# Patient Record
Sex: Male | Born: 1970 | Race: White | Hispanic: No | Marital: Single | Smoking: Never smoker
Health system: Southern US, Community
[De-identification: ages and names within clinical notes are randomized; demographics above are authoritative.]

## PROBLEM LIST (undated history)

## (undated) DIAGNOSIS — K219 Gastro-esophageal reflux disease without esophagitis: Secondary | ICD-10-CM

## (undated) DIAGNOSIS — L719 Rosacea, unspecified: Secondary | ICD-10-CM

## (undated) DIAGNOSIS — N200 Calculus of kidney: Secondary | ICD-10-CM

## (undated) DIAGNOSIS — K449 Diaphragmatic hernia without obstruction or gangrene: Secondary | ICD-10-CM

## (undated) DIAGNOSIS — R350 Frequency of micturition: Secondary | ICD-10-CM

## (undated) DIAGNOSIS — N201 Calculus of ureter: Secondary | ICD-10-CM

## (undated) DIAGNOSIS — R319 Hematuria, unspecified: Secondary | ICD-10-CM

## (undated) DIAGNOSIS — N1330 Unspecified hydronephrosis: Secondary | ICD-10-CM

## (undated) DIAGNOSIS — N471 Phimosis: Secondary | ICD-10-CM

## (undated) DIAGNOSIS — Z973 Presence of spectacles and contact lenses: Secondary | ICD-10-CM

## (undated) HISTORY — PX: ESOPHAGOGASTRODUODENOSCOPY: SHX1529

---

## 2015-11-13 ENCOUNTER — Emergency Department (HOSPITAL_COMMUNITY)
Admission: EM | Admit: 2015-11-13 | Discharge: 2015-11-14 | Disposition: A | Discharge status: Home / Self Care | Payer: BLUE CROSS/BLUE SHIELD | Attending: Emergency Medicine | Admitting: Emergency Medicine

## 2015-11-13 ENCOUNTER — Encounter (HOSPITAL_COMMUNITY): Payer: Self-pay

## 2015-11-13 DIAGNOSIS — N23 Unspecified renal colic: Secondary | ICD-10-CM

## 2015-11-13 DIAGNOSIS — F17200 Nicotine dependence, unspecified, uncomplicated: Secondary | ICD-10-CM | POA: Insufficient documentation

## 2015-11-13 DIAGNOSIS — N132 Hydronephrosis with renal and ureteral calculous obstruction: Secondary | ICD-10-CM | POA: Diagnosis not present

## 2015-11-13 DIAGNOSIS — E876 Hypokalemia: Secondary | ICD-10-CM | POA: Diagnosis not present

## 2015-11-13 DIAGNOSIS — R109 Unspecified abdominal pain: Secondary | ICD-10-CM | POA: Diagnosis present

## 2015-11-13 MED ORDER — FENTANYL CITRATE (PF) 100 MCG/2ML IJ SOLN
50.0000 ug | INTRAMUSCULAR | Status: DC | PRN
  Administered 2015-11-14: 50 ug via INTRAVENOUS
  Filled 2015-11-13: qty 2

## 2015-11-13 MED ORDER — ONDANSETRON 4 MG PO TBDP
4.0000 mg | ORAL_TABLET | Freq: Once | ORAL | Status: AC
  Administered 2015-11-13: 4 mg via ORAL

## 2015-11-13 MED ORDER — ONDANSETRON 4 MG PO TBDP
ORAL_TABLET | ORAL | Status: AC
  Filled 2015-11-13: qty 1

## 2015-11-13 NOTE — ED Provider Notes (Signed)
[facility]-EMERGENCY DEPT Provider Note   CSN: [id number] Arrival date & time: 11/13/15  2245     History   Chief Complaint No chief complaint on file.   HPI Peter Mills is a 45 y.o. male with a hx of Hypertension (diet and exercise controlled) presents to the Emergency Department complaining of gradual, persistent, progressively worsening left flank pain onset 3 weeks ago that acutely worsened to 10/10 at 7 PM. Patient reports he feels as if there is a hot iron and his left flank. He reports some decreased and very dark urine. He also reports that he has been out a football came all day and had minimal water. Patient reports a family history of chronic kidney disease but he does not personally have any known kidney disease. No history of kidney stones. Nothing makes it symptoms better or worse. He's had associated nausea, vomiting and dry heaves.    The history is provided by the patient and medical records. No language interpreter was used.    History reviewed. No pertinent past medical history.  There are no active problems to display for this patient.   History reviewed. No pertinent surgical history.     Home Medications    Prior to Admission medications   Medication Sig Start Date End Date Taking? Authorizing Provider  doxycycline (VIBRAMYCIN) 50 MG capsule Take 50 mg by mouth daily. 10/07/15  Yes Historical Provider, MD  ondansetron (ZOFRAN ODT) 4 MG disintegrating tablet 4mg  ODT q4 hours prn nausea/vomit 11/14/15   Najeeb Uptain, PA-C  oxyCODONE (ROXICODONE) 5 MG immediate release tablet Take 1 tablet (5 mg total) by mouth every 6 (six) hours as needed for severe pain. 11/14/15   Van Ehlert, PA-C  tamsulosin (FLOMAX) 0.4 MG CAPS capsule Take 1 capsule (0.4 mg total) by mouth 2 (two) times daily. 11/14/15   Dahlia ClientHannah Halimah Bewick, PA-C    Family History No family history on file.  Social History Social History  Substance Use Topics  . Smoking status: Current  Every Day Smoker  . Smokeless tobacco: Never Used  . Alcohol use No     Allergies   Hydrocodone   Review of Systems Review of Systems  Gastrointestinal: Negative for abdominal pain.  Genitourinary: Positive for decreased urine volume and flank pain. Negative for dysuria, penile pain, penile swelling and testicular pain.       Dark urine  All other systems reviewed and are negative.    Physical Exam Updated Vital Signs BP 128/78   Pulse 77   Temp 98 F (36.7 C) (Oral)   Resp 18   Ht 5\' 9"  (1.753 m)   Wt 105.9 kg   SpO2 93%   BMI 34.47 kg/m   Physical Exam  Constitutional: He appears well-developed and well-nourished. He appears distressed ( pt writhing in pain).  HENT:  Head: Normocephalic and atraumatic.  Mouth/Throat: Oropharynx is clear and moist. No oropharyngeal exudate.  Cardiovascular: Normal rate, regular rhythm, normal heart sounds and intact distal pulses.   Pulmonary/Chest: Effort normal and breath sounds normal. No respiratory distress. He has no wheezes.  Abdominal: Soft. Bowel sounds are normal. He exhibits no distension and no mass. There is no tenderness. There is CVA tenderness (left). There is no rebound and no guarding.  Musculoskeletal: Normal range of motion. He exhibits no edema.  Neurological: He is alert.  Skin: Skin is warm and dry. No rash noted. He is not diaphoretic.  Psychiatric: He has a normal mood and affect.  Nursing note and vitals  reviewed.    ED Treatments / Results  Labs (all labs ordered are listed, but only abnormal results are displayed) Labs Reviewed  URINALYSIS, ROUTINE W REFLEX MICROSCOPIC (NOT AT [facility]) - Abnormal; Notable for the following:       Result Value   Hgb urine dipstick LARGE (*)    Leukocytes, UA SMALL (*)    All other components within normal limits  CBC WITH DIFFERENTIAL/PLATELET - Abnormal; Notable for the following:    WBC 18.4 (*)    Neutro Abs 15.7 (*)    All other components within normal limits    BASIC METABOLIC PANEL - Abnormal; Notable for the following:    Potassium 3.3 (*)    CO2 21 (*)    Glucose, Bld 132 (*)    Creatinine, Ser 1.40 (*)    GFR calc non Af Amer 59 (*)    All other components within normal limits  URINE MICROSCOPIC-ADD ON - Abnormal; Notable for the following:    Squamous Epithelial / LPF 0-5 (*)    Bacteria, UA RARE (*)    Crystals CA OXALATE CRYSTALS (*)    All other components within normal limits    EKG  EKG Interpretation None       Radiology Ct Renal Stone Study  Result Date: 11/14/2015 CLINICAL DATA:  Acute onset of left flank pain, hematuria and burning. Initial encounter. EXAM: CT ABDOMEN AND PELVIS WITHOUT CONTRAST TECHNIQUE: Multidetector CT imaging of the abdomen and pelvis was performed following the standard protocol without IV contrast. COMPARISON:  None. FINDINGS: Lower chest: Mild bibasilar opacities likely reflect atelectasis. Hepatobiliary: The liver is unremarkable in appearance. The gallbladder is unremarkable in appearance. The common bile duct remains normal in caliber. Pancreas: The pancreas is within normal limits. Spleen: The spleen is mildly enlarged, measuring 13.7 cm in length. Adrenals/Urinary Tract: The adrenal glands are unremarkable in appearance. Minimal left-sided hydronephrosis is noted, with left-sided perinephric stranding, and an obstructing 7 x 4 mm stone at the proximal left ureter, 3 cm below the left renal pelvis. A nonobstructing 4 mm stone is noted at the interpole region of the right kidney. Minimal right-sided perinephric stranding is seen. Stomach/Bowel: The stomach is unremarkable in appearance. The small bowel is within normal limits. The appendix is normal in caliber, without evidence of appendicitis. The colon is unremarkable in appearance. Vascular/Lymphatic: The abdominal aorta is unremarkable in appearance. The inferior vena cava is grossly unremarkable. No retroperitoneal lymphadenopathy is seen. No pelvic  sidewall lymphadenopathy is identified. Reproductive: The bladder is mildly distended and grossly unremarkable. The prostate remains normal in size. Other: No additional soft tissue abnormalities are seen. Musculoskeletal: No acute osseous abnormalities are identified. The visualized musculature is unremarkable in appearance. IMPRESSION: 1. Minimal left-sided hydronephrosis, with an obstructing 7 x 4 mm stone at the proximal left ureter, 3 cm below the left renal pelvis. 2. Nonobstructing 4 mm stone at the interpole region of the right kidney. 3. Mild splenomegaly. 4. Mild bibasilar opacities likely reflect atelectasis. Electronically Signed   By: Roanna Raider M.D.   On: 11/14/2015 02:38    Procedures Procedures (including critical care time)  Medications Ordered in ED Medications  ondansetron (ZOFRAN-ODT) 4 MG disintegrating tablet (not administered)  fentaNYL (SUBLIMAZE) injection 50 mcg (50 mcg Intravenous Given 11/14/15 0009)  sodium chloride 0.9 % bolus 1,000 mL (not administered)  penicillin g benzathine (BICILLIN LA) 1200000 UNIT/2ML injection 1.2 Million Units (not administered)  dexamethasone (DECADRON) injection 10 mg (not administered)  ketorolac (TORADOL)  30 MG/ML injection 30 mg (not administered)  potassium chloride SA (K-DUR,KLOR-CON) CR tablet 40 mEq (not administered)  ondansetron (ZOFRAN-ODT) disintegrating tablet 4 mg (4 mg Oral Given 11/13/15 2333)     Initial Impression / Assessment and Plan / ED Course  I have reviewed the triage vital signs and the nursing notes.  Pertinent labs & imaging results that were available during my care of the patient were reviewed by me and considered in my medical decision making (see chart for details).  Clinical Course  Value Comment By Time  WBC: (!) 18.4 Leukocytosis Dierdre Forth, PA-C 09/24 0324  Potassium: (!) 3.3 Mild hypokalemia Dierdre Forth, PA-C 09/24 0324  Creatinine: (!) 1.40 Small elevation in serum creatinine  Dierdre Forth, PA-C 09/24 0324  Leukocytes, UA: (!) SMALL Small leukocytes and large hemoglobin but no nitrites and no elevation in white blood cells. No indication for infected stone. Dahlia Client Norrine Ballester, PA-C 09/24 0325  BP: 128/66 Vital signs are stable. Patient is without tachycardia or fever. Dierdre Forth, PA-C 09/24 1610  CT Renal Stone Study Minimal left-sided hydronephrosis, with an obstructing 7 x 4 mm stone at the proximal left ureter, 3 cm below the left renal pelvis   Dierdre Forth, PA-C 09/24 0325    Pt has been diagnosed with a Kidney Stone via CT. There is no evidence of significant hydronephrosis, serum creatine minimally elevated (unknown baseline), vitals sign stable and the pt does not have irratractable vomiting. This is patient's first stone. He has not attempted in outpatient trial to pass the stone. Discussed risks versus benefits of attempting to pass a large stone. Patient will give outpatient trial.  He will be discharged home with Flomax, oxycodone, Zofran.  Patient is to follow-up with urology early next week for further evaluation as he may need surgical intervention for the stone.   Final Clinical Impressions(s) / ED Diagnoses   Final diagnoses:  Renal colic on left side  Ureteral stone with hydronephrosis  Hypokalemia    New Prescriptions New Prescriptions   ONDANSETRON (ZOFRAN ODT) 4 MG DISINTEGRATING TABLET    4mg  ODT q4 hours prn nausea/vomit   OXYCODONE (ROXICODONE) 5 MG IMMEDIATE RELEASE TABLET    Take 1 tablet (5 mg total) by mouth every 6 (six) hours as needed for severe pain.   TAMSULOSIN (FLOMAX) 0.4 MG CAPS CAPSULE    Take 1 capsule (0.4 mg total) by mouth 2 (two) times daily.     Dahlia Client Kunio Cummiskey, PA-C 11/14/15 9604    Shon Baton, MD 11/14/15 2300

## 2015-11-13 NOTE — ED Triage Notes (Signed)
Pt states that he has been having L sided flank pain that has been going on for the past three weeks, today pain is unbearable, urine is dark and and n/v x 1

## 2015-11-14 ENCOUNTER — Emergency Department (HOSPITAL_COMMUNITY): Payer: BLUE CROSS/BLUE SHIELD

## 2015-11-14 LAB — BASIC METABOLIC PANEL
Anion gap: 8 (ref 5–15)
BUN: 14 mg/dL (ref 6–20)
CHLORIDE: 106 mmol/L (ref 101–111)
CO2: 21 mmol/L — ABNORMAL LOW (ref 22–32)
CREATININE: 1.4 mg/dL — AB (ref 0.61–1.24)
Calcium: 9.2 mg/dL (ref 8.9–10.3)
GFR calc Af Amer: >60 mL/min (ref >60)
GFR calc non Af Amer: 59 mL/min — ABNORMAL LOW (ref >60)
GLUCOSE: 132 mg/dL — AB (ref 65–99)
POTASSIUM: 3.3 mmol/L — AB (ref 3.5–5.1)
Sodium: 135 mmol/L (ref 135–145)

## 2015-11-14 LAB — CBC WITH DIFFERENTIAL/PLATELET
Basophils Absolute: 0 10*3/uL (ref 0.0–0.1)
Basophils Relative: 0 %
EOS ABS: 0 10*3/uL (ref 0.0–0.7)
EOS PCT: 0 %
HCT: 46.3 % (ref 39.0–52.0)
HEMOGLOBIN: 15.3 g/dL (ref 13.0–17.0)
LYMPHS ABS: 1.8 10*3/uL (ref 0.7–4.0)
Lymphocytes Relative: 10 %
MCH: 28.6 pg (ref 26.0–34.0)
MCHC: 33 g/dL (ref 30.0–36.0)
MCV: 86.5 fL (ref 78.0–100.0)
MONOS PCT: 4 %
Monocytes Absolute: 0.7 10*3/uL (ref 0.1–1.0)
NEUTROS PCT: 86 %
Neutro Abs: 15.7 10*3/uL — ABNORMAL HIGH (ref 1.7–7.7)
Platelets: 317 10*3/uL (ref 150–400)
RBC: 5.35 MIL/uL (ref 4.22–5.81)
RDW: 13.7 % (ref 11.5–15.5)
WBC: 18.4 10*3/uL — AB (ref 4.0–10.5)

## 2015-11-14 LAB — URINE MICROSCOPIC-ADD ON

## 2015-11-14 LAB — URINALYSIS, ROUTINE W REFLEX MICROSCOPIC
BILIRUBIN URINE: NEGATIVE
Glucose, UA: NEGATIVE mg/dL
Ketones, ur: NEGATIVE mg/dL
Nitrite: NEGATIVE
PH: 6 (ref 5.0–8.0)
Protein, ur: NEGATIVE mg/dL
SPECIFIC GRAVITY, URINE: 1.01 (ref 1.005–1.030)

## 2015-11-14 MED ORDER — KETOROLAC TROMETHAMINE 30 MG/ML IJ SOLN
30.0000 mg | Freq: Once | INTRAMUSCULAR | Status: AC
  Administered 2015-11-14: 30 mg via INTRAVENOUS
  Filled 2015-11-14: qty 1

## 2015-11-14 MED ORDER — OXYCODONE HCL 5 MG PO TABS: 5.0000 mg | ORAL_TABLET | Freq: Four times a day (QID) | ORAL | 0 refills | Status: DC | PRN

## 2015-11-14 MED ORDER — TAMSULOSIN HCL 0.4 MG PO CAPS: 0.4000 mg | ORAL_CAPSULE | Freq: Two times a day (BID) | ORAL | 0 refills | Status: DC

## 2015-11-14 MED ORDER — POTASSIUM CHLORIDE CRYS ER 20 MEQ PO TBCR
40.0000 meq | EXTENDED_RELEASE_TABLET | Freq: Once | ORAL | Status: AC
  Administered 2015-11-14: 40 meq via ORAL
  Filled 2015-11-14: qty 2

## 2015-11-14 MED ORDER — PENICILLIN G BENZATHINE 1200000 UNIT/2ML IM SUSP
1.2000 10*6.[IU] | Freq: Once | INTRAMUSCULAR | Status: AC
  Administered 2015-11-14: 1.2 10*6.[IU] via INTRAMUSCULAR
  Filled 2015-11-14: qty 2

## 2015-11-14 MED ORDER — ONDANSETRON 4 MG PO TBDP: ORAL_TABLET | ORAL | 0 refills | Status: DC

## 2015-11-14 MED ORDER — DEXAMETHASONE SODIUM PHOSPHATE 10 MG/ML IJ SOLN
10.0000 mg | Freq: Once | INTRAMUSCULAR | Status: AC
  Administered 2015-11-14: 10 mg via INTRAVENOUS
  Filled 2015-11-14: qty 1

## 2015-11-14 MED ORDER — SODIUM CHLORIDE 0.9 % IV BOLUS (SEPSIS)
1000.0000 mL | Freq: Once | INTRAVENOUS | Status: AC
  Administered 2015-11-14: 1000 mL via INTRAVENOUS

## 2015-11-14 NOTE — Discharge Instructions (Signed)
1. Medications: Oxycodone for moderate to severe pain, Tylenol 1000 mg every 6-8 hours (do not exceed 4 g in 24 hours), Zofran for nausea, Flomax, usual home medications 2. Treatment: rest, drink plenty of fluids,  3. Follow Up: Please followup with urology in 2-3 days for discussion of your diagnoses and further evaluation after today's visit; if you do not have a primary care doctor use the resource guide provided to find one; Please return to the ER for fevers, intractable pain, intractable vomiting or other concerns

## 2015-11-19 ENCOUNTER — Other Ambulatory Visit: Payer: Self-pay | Admitting: Urology

## 2015-11-22 ENCOUNTER — Encounter (HOSPITAL_BASED_OUTPATIENT_CLINIC_OR_DEPARTMENT_OTHER): Payer: Self-pay | Admitting: *Deleted

## 2015-11-22 NOTE — Progress Notes (Signed)
NPO AFTER MN .   ARRIVE AT 1100.  CURRENT LAB RESULTS  IN CHART AND EPIC.  WILL TAKE FLOMAX AND TYLENOL AM DOS W/ SIPS OF WATER AND IF NEEDED TAKE ZOFRAN.

## 2015-11-25 ENCOUNTER — Other Ambulatory Visit: Payer: Self-pay

## 2015-11-25 ENCOUNTER — Encounter (HOSPITAL_BASED_OUTPATIENT_CLINIC_OR_DEPARTMENT_OTHER)
Admission: RE | Disposition: A | Discharge status: Home / Self Care | Payer: Self-pay | Source: Ambulatory Visit | Attending: Urology

## 2015-11-25 ENCOUNTER — Encounter (HOSPITAL_BASED_OUTPATIENT_CLINIC_OR_DEPARTMENT_OTHER): Payer: Self-pay | Admitting: Anesthesiology

## 2015-11-25 ENCOUNTER — Ambulatory Visit (HOSPITAL_BASED_OUTPATIENT_CLINIC_OR_DEPARTMENT_OTHER): Payer: BLUE CROSS/BLUE SHIELD | Admitting: Anesthesiology

## 2015-11-25 ENCOUNTER — Ambulatory Visit (HOSPITAL_BASED_OUTPATIENT_CLINIC_OR_DEPARTMENT_OTHER)
Admission: RE | Admit: 2015-11-25 | Discharge: 2015-11-25 | Disposition: A | Discharge status: Home / Self Care | Payer: BLUE CROSS/BLUE SHIELD | Source: Ambulatory Visit | Attending: Urology | Admitting: Urology

## 2015-11-25 DIAGNOSIS — N132 Hydronephrosis with renal and ureteral calculous obstruction: Secondary | ICD-10-CM | POA: Insufficient documentation

## 2015-11-25 DIAGNOSIS — Z6834 Body mass index (BMI) 34.0-34.9, adult: Secondary | ICD-10-CM | POA: Diagnosis not present

## 2015-11-25 DIAGNOSIS — N471 Phimosis: Secondary | ICD-10-CM | POA: Diagnosis not present

## 2015-11-25 DIAGNOSIS — N201 Calculus of ureter: Secondary | ICD-10-CM | POA: Diagnosis present

## 2015-11-25 HISTORY — DX: Phimosis: N47.1

## 2015-11-25 HISTORY — DX: Hematuria, unspecified: R31.9

## 2015-11-25 HISTORY — DX: Calculus of kidney: N20.0

## 2015-11-25 HISTORY — PX: CYSTOSCOPY WITH RETROGRADE PYELOGRAM, URETEROSCOPY AND STENT PLACEMENT: SHX5789

## 2015-11-25 HISTORY — DX: Gastro-esophageal reflux disease without esophagitis: K21.9

## 2015-11-25 HISTORY — DX: Presence of spectacles and contact lenses: Z97.3

## 2015-11-25 HISTORY — PX: URETEROSCOPY: SHX842

## 2015-11-25 HISTORY — DX: Diaphragmatic hernia without obstruction or gangrene: K44.9

## 2015-11-25 HISTORY — DX: Calculus of ureter: N20.1

## 2015-11-25 HISTORY — DX: Frequency of micturition: R35.0

## 2015-11-25 HISTORY — PX: HOLMIUM LASER APPLICATION: SHX5852

## 2015-11-25 HISTORY — DX: Unspecified hydronephrosis: N13.30

## 2015-11-25 HISTORY — PX: CIRCUMCISION: SHX1350

## 2015-11-25 HISTORY — DX: Rosacea, unspecified: L71.9

## 2015-11-25 LAB — POCT I-STAT, CHEM 8
BUN: 13 mg/dL (ref 6–20)
CALCIUM ION: 1.22 mmol/L (ref 1.15–1.40)
Chloride: 104 mmol/L (ref 101–111)
Creatinine, Ser: 1 mg/dL (ref 0.61–1.24)
Glucose, Bld: 116 mg/dL — ABNORMAL HIGH (ref 65–99)
HCT: 49 % (ref 39.0–52.0)
HEMOGLOBIN: 16.7 g/dL (ref 13.0–17.0)
Potassium: 3.6 mmol/L (ref 3.5–5.1)
SODIUM: 142 mmol/L (ref 135–145)
TCO2: 22 mmol/L (ref 0–100)

## 2015-11-25 SURGERY — CYSTOURETEROSCOPY, WITH RETROGRADE PYELOGRAM AND STENT INSERTION
Anesthesia: General | Site: Ureter | Laterality: Right

## 2015-11-25 MED ORDER — BUPIVACAINE HCL (PF) 0.5 % IJ SOLN
INTRAMUSCULAR | Status: DC | PRN
  Administered 2015-11-25: 10 mL

## 2015-11-25 MED ORDER — CLINDAMYCIN PHOSPHATE 900 MG/50ML IV SOLN
INTRAVENOUS | Status: AC
  Filled 2015-11-25: qty 50

## 2015-11-25 MED ORDER — MIDAZOLAM HCL 2 MG/2ML IJ SOLN
INTRAMUSCULAR | Status: AC
  Filled 2015-11-25: qty 2

## 2015-11-25 MED ORDER — CLINDAMYCIN PHOSPHATE 900 MG/50ML IV SOLN
900.0000 mg | INTRAVENOUS | Status: AC
  Administered 2015-11-25: 900 mg via INTRAVENOUS
  Filled 2015-11-25: qty 50

## 2015-11-25 MED ORDER — HYDROMORPHONE HCL 1 MG/ML IJ SOLN
0.2500 mg | INTRAMUSCULAR | Status: DC | PRN
  Filled 2015-11-25: qty 1

## 2015-11-25 MED ORDER — SODIUM CHLORIDE 0.9 % IR SOLN
Status: DC | PRN
  Administered 2015-11-25: 4000 mL via INTRAVESICAL

## 2015-11-25 MED ORDER — LACTATED RINGERS IV SOLN
INTRAVENOUS | Status: DC
  Administered 2015-11-25 (×2): via INTRAVENOUS
  Filled 2015-11-25: qty 1000

## 2015-11-25 MED ORDER — GENTAMICIN IN SALINE 1.6-0.9 MG/ML-% IV SOLN
80.0000 mg | INTRAVENOUS | Status: DC
  Filled 2015-11-25: qty 50

## 2015-11-25 MED ORDER — PROPOFOL 10 MG/ML IV BOLUS
INTRAVENOUS | Status: DC | PRN
  Administered 2015-11-25: 200 mg via INTRAVENOUS
  Administered 2015-11-25: 30 mg via INTRAVENOUS

## 2015-11-25 MED ORDER — OXYCODONE HCL 5 MG PO TABS
5.0000 mg | ORAL_TABLET | Freq: Once | ORAL | Status: AC
Start: 2015-11-25 — End: 2015-11-25
  Administered 2015-11-25: 5 mg via ORAL
  Filled 2015-11-25: qty 1

## 2015-11-25 MED ORDER — MIDAZOLAM HCL 5 MG/5ML IJ SOLN
INTRAMUSCULAR | Status: DC | PRN
  Administered 2015-11-25: 2 mg via INTRAVENOUS

## 2015-11-25 MED ORDER — ONDANSETRON HCL 4 MG/2ML IJ SOLN
INTRAMUSCULAR | Status: AC
  Filled 2015-11-25: qty 2

## 2015-11-25 MED ORDER — FENTANYL CITRATE (PF) 100 MCG/2ML IJ SOLN
INTRAMUSCULAR | Status: DC | PRN
  Administered 2015-11-25 (×2): 50 ug via INTRAVENOUS
  Administered 2015-11-25: 25 ug via INTRAVENOUS
  Administered 2015-11-25: 50 ug via INTRAVENOUS
  Administered 2015-11-25: 25 ug via INTRAVENOUS

## 2015-11-25 MED ORDER — IOHEXOL 300 MG/ML  SOLN
INTRAMUSCULAR | Status: DC | PRN
  Administered 2015-11-25: 38 mL via ORAL

## 2015-11-25 MED ORDER — ONDANSETRON HCL 4 MG/2ML IJ SOLN
INTRAMUSCULAR | Status: DC | PRN
  Administered 2015-11-25: 4 mg via INTRAVENOUS

## 2015-11-25 MED ORDER — TAMSULOSIN HCL 0.4 MG PO CAPS
ORAL_CAPSULE | ORAL | Status: AC
  Filled 2015-11-25: qty 1

## 2015-11-25 MED ORDER — LIDOCAINE 2% (20 MG/ML) 5 ML SYRINGE
INTRAMUSCULAR | Status: AC
  Filled 2015-11-25: qty 5

## 2015-11-25 MED ORDER — PROMETHAZINE HCL 25 MG/ML IJ SOLN
6.2500 mg | INTRAMUSCULAR | Status: DC | PRN
  Filled 2015-11-25: qty 1

## 2015-11-25 MED ORDER — TAMSULOSIN HCL 0.4 MG PO CAPS
0.4000 mg | ORAL_CAPSULE | Freq: Every day | ORAL | Status: DC
  Administered 2015-11-25: 0.4 mg via ORAL
  Filled 2015-11-25: qty 1

## 2015-11-25 MED ORDER — LIDOCAINE 2% (20 MG/ML) 5 ML SYRINGE
INTRAMUSCULAR | Status: DC | PRN
  Administered 2015-11-25: 100 mg via INTRAVENOUS

## 2015-11-25 MED ORDER — FENTANYL CITRATE (PF) 100 MCG/2ML IJ SOLN
INTRAMUSCULAR | Status: AC
  Filled 2015-11-25: qty 2

## 2015-11-25 MED ORDER — DEXAMETHASONE SODIUM PHOSPHATE 10 MG/ML IJ SOLN
INTRAMUSCULAR | Status: AC
  Filled 2015-11-25: qty 1

## 2015-11-25 MED ORDER — OXYCODONE HCL 5 MG PO TABS
ORAL_TABLET | ORAL | Status: AC
  Filled 2015-11-25: qty 1

## 2015-11-25 MED ORDER — OXYCODONE HCL 5 MG PO TABS: 5.0000 mg | ORAL_TABLET | Freq: Four times a day (QID) | ORAL | 0 refills | Status: DC | PRN

## 2015-11-25 MED ORDER — GENTAMICIN SULFATE 40 MG/ML IJ SOLN
5.0000 mg/kg | Freq: Once | INTRAVENOUS | Status: AC
  Administered 2015-11-25: 420 mg via INTRAVENOUS
  Filled 2015-11-25 (×2): qty 10.5

## 2015-11-25 MED ORDER — DEXAMETHASONE SODIUM PHOSPHATE 4 MG/ML IJ SOLN
INTRAMUSCULAR | Status: DC | PRN
  Administered 2015-11-25: 10 mg via INTRAVENOUS

## 2015-11-25 MED ORDER — KETOROLAC TROMETHAMINE 10 MG PO TABS: 10.0000 mg | ORAL_TABLET | Freq: Four times a day (QID) | ORAL | 1 refills | Status: DC | PRN

## 2015-11-25 MED ORDER — CEPHALEXIN 500 MG PO CAPS: 500.0000 mg | ORAL_CAPSULE | Freq: Two times a day (BID) | ORAL | 0 refills | Status: DC

## 2015-11-25 MED ORDER — PROPOFOL 10 MG/ML IV BOLUS
INTRAVENOUS | Status: AC
  Filled 2015-11-25: qty 40

## 2015-11-25 SURGICAL SUPPLY — 56 items
BAG DRAIN URO-CYSTO SKYTR STRL (DRAIN) ×6 IMPLANT
BANDAGE COBAN STERILE 2 (GAUZE/BANDAGES/DRESSINGS) ×6 IMPLANT
BASKET DAKOTA 1.9FR 11X120 (BASKET) IMPLANT
BASKET LASER NITINOL 1.9FR (BASKET) ×6 IMPLANT
BASKET ZERO TIP NITINOL 2.4FR (BASKET) IMPLANT
BLADE SURG 15 STRL LF DISP TIS (BLADE) ×8 IMPLANT
BLADE SURG 15 STRL SS (BLADE) ×4
BNDG CONFORM 2 STRL LF (GAUZE/BANDAGES/DRESSINGS) ×6 IMPLANT
CATH INTERMIT  6FR 70CM (CATHETERS) ×6 IMPLANT
CATH URET 5FR 28IN OPEN ENDED (CATHETERS) IMPLANT
CLOTH BEACON ORANGE TIMEOUT ST (SAFETY) ×6 IMPLANT
DECANTER SPIKE VIAL GLASS SM (MISCELLANEOUS) IMPLANT
ELECT NEEDLE TIP 2.8 STRL (NEEDLE) ×6 IMPLANT
ELECT REM PT RETURN 9FT ADLT (ELECTROSURGICAL) ×6
ELECTRODE REM PT RTRN 9FT ADLT (ELECTROSURGICAL) ×4 IMPLANT
FIBER LASER LITHO 273 (Laser) ×6 IMPLANT
FIBER LASER TRAC TIP (UROLOGICAL SUPPLIES) IMPLANT
GAUZE SPONGE 4X4 16PLY XRAY LF (GAUZE/BANDAGES/DRESSINGS) ×6 IMPLANT
GAUZE XEROFORM 1X8 LF (GAUZE/BANDAGES/DRESSINGS) ×6 IMPLANT
GLOVE BIO SURGEON STRL SZ 6.5 (GLOVE) ×10 IMPLANT
GLOVE BIO SURGEON STRL SZ7.5 (GLOVE) ×6 IMPLANT
GLOVE BIO SURGEONS STRL SZ 6.5 (GLOVE) ×2
GLOVE BIOGEL PI IND STRL 6.5 (GLOVE) ×4 IMPLANT
GLOVE BIOGEL PI INDICATOR 6.5 (GLOVE) ×2
GOWN STRL REUS W/ TWL LRG LVL3 (GOWN DISPOSABLE) ×8 IMPLANT
GOWN STRL REUS W/ TWL XL LVL3 (GOWN DISPOSABLE) ×4 IMPLANT
GOWN STRL REUS W/TWL LRG LVL3 (GOWN DISPOSABLE) ×4
GOWN STRL REUS W/TWL XL LVL3 (GOWN DISPOSABLE) ×2
GUIDEWIRE 0.038 PTFE COATED (WIRE) IMPLANT
GUIDEWIRE ANG ZIPWIRE 038X150 (WIRE) ×12 IMPLANT
GUIDEWIRE STR DUAL SENSOR (WIRE) ×6 IMPLANT
IV NS 1000ML (IV SOLUTION) ×2
IV NS 1000ML BAXH (IV SOLUTION) ×4 IMPLANT
IV NS IRRIG 3000ML ARTHROMATIC (IV SOLUTION) ×12 IMPLANT
KIT BALLIN UROMAX 15FX10 (LABEL) IMPLANT
KIT BALLN UROMAX 15FX4 (MISCELLANEOUS) IMPLANT
KIT BALLN UROMAX 26 75X4 (MISCELLANEOUS)
KIT ROOM TURNOVER WOR (KITS) ×6 IMPLANT
MANIFOLD NEPTUNE II (INSTRUMENTS) ×6 IMPLANT
NEEDLE HYPO 25X1 1.5 SAFETY (NEEDLE) ×6 IMPLANT
NS IRRIG 500ML POUR BTL (IV SOLUTION) ×6 IMPLANT
PACK BASIN DAY SURGERY FS (CUSTOM PROCEDURE TRAY) ×6 IMPLANT
PACK CYSTO (CUSTOM PROCEDURE TRAY) ×6 IMPLANT
SET HIGH PRES BAL DIL (LABEL)
SHEATH ACCESS URETERAL 38CM (SHEATH) ×6 IMPLANT
SPONGE GAUZE 4X4 12PLY (GAUZE/BANDAGES/DRESSINGS) ×6 IMPLANT
STENT POLARIS 5FRX26 (STENTS) ×6 IMPLANT
STENT URET 6FRX26 CONTOUR (STENTS) ×6 IMPLANT
SUT VICRYL 4-0 PS2 18IN ABS (SUTURE) ×6 IMPLANT
SYR CONTROL 10ML LL (SYRINGE) ×6 IMPLANT
SYRINGE 10CC LL (SYRINGE) ×6 IMPLANT
SYRINGE IRR TOOMEY STRL 70CC (SYRINGE) IMPLANT
TUBE CONNECTING 12'X1/4 (SUCTIONS) ×1
TUBE CONNECTING 12X1/4 (SUCTIONS) ×5 IMPLANT
TUBE FEEDING 8FR 16IN STR KANG (MISCELLANEOUS) ×6 IMPLANT
WATER STERILE IRR 500ML POUR (IV SOLUTION) IMPLANT

## 2015-11-25 NOTE — H&P (Signed)
Peter SewerMatthew C Mills is an 45 y.o. male.    Chief Complaint: Pre-op Bilateral Ureteroscopic Stone Manipulation and Preputioplasty  HPI:   1 - Nephrolithiasis - Left 7mm prox ureteral with mild hydro (SSD 13cm, 650HU, just lateral to L3 vertebral body) and Rt mid 4mm papillary tip calcificaiton by ER CT on eval flank pain. Cr 1.4. Not easily visible on KUB. UA without infectios parameters. Given trial of medical therapy and reports no interval passage.   2 - Phimosis - pt pre-mature birth and uncircumcised. Increasingly bothered x years with phimosis interfering with activity and hygiene. He desires therapy but does not want formal circumcision.   PMH sig for childhood murmur (outgrew, no sequelae). NO CV disease / blood thinners. His PCP is Heide GuileLynn Lam, NP.   Today " Peter Mills" is seen to proceed with bilateral ureteroscopy and preputioplasty for bilateral nephrolithiasis and phimosis. NO interval fevers.    Past Medical History:  Diagnosis Date  . Frequency of urination   . GERD (gastroesophageal reflux disease)   . Hematuria   . Hiatal hernia   . Hydronephrosis, left   . Left ureteral stone   . Phimosis   . Renal calculus, right    NON-OBSTRUCTIVE PER CT 11-14-2015  . Rosacea, acne   . Wears glasses     Past Surgical History:  Procedure Laterality Date  . ESOPHAGOGASTRODUODENOSCOPY  2005 approx    History reviewed. No pertinent family history. Social History:  reports that he has never smoked. He has never used smokeless tobacco. He reports that he drinks alcohol. He reports that he does not use drugs.  Allergies:  Allergies  Allergen Reactions  . Hydrocodone Nausea And Vomiting    No prescriptions prior to admission.    No results found for this or any previous visit (from the past 48 hour(s)). No results found.  Review of Systems  Constitutional: Negative.  Negative for chills and fever.  HENT: Negative.   Eyes: Negative.   Respiratory: Negative.   Cardiovascular:  Negative.   Gastrointestinal: Positive for nausea. Negative for vomiting.  Genitourinary: Positive for flank pain.  Skin: Negative.   Neurological: Negative.   Endo/Heme/Allergies: Negative.   Psychiatric/Behavioral: Negative.     Height 5\' 9"  (1.753 m), weight 105.7 kg (233 lb). Physical Exam  Constitutional: He appears well-developed.  Eyes: Pupils are equal, round, and reactive to light.  Neck: Normal range of motion.  Cardiovascular: Normal rate.   Respiratory: Effort normal.  GI: Soft.  Genitourinary:  Genitourinary Comments: Moderate phimosis.   Musculoskeletal: Normal range of motion.  Neurological: He is alert.  Skin: Skin is warm.  Psychiatric: He has a normal mood and affect. His behavior is normal. Judgment and thought content normal.     Assessment/Plan  Rediscussed management options incluidng continued medical therapy (<40% chance of passage), left SWL (>80% chance of pasge), or left v. bilateral ureteroscopy (>90% chance stone free) and he opts for bilateral URS  Today as planned.   He would alos like "preputiopplasty" aka V-Y plasty to reduce foreskin. Risks, beneftis, alternatives discussed in detail as well as need por possible staged approach.      Sebastian AcheMANNY, Tarus Briski, MD 11/25/2015, 10:38 AM

## 2015-11-25 NOTE — Anesthesia Preprocedure Evaluation (Signed)
Anesthesia Evaluation  Patient identified by MRN, date of birth, ID band Patient awake    Reviewed: Allergy & Precautions, NPO status , Patient's Chart, lab work & pertinent test results  Airway Mallampati: III  TM Distance: >3 FB Neck ROM: Full    Dental no notable dental hx. (+) Dental Advisory Given   Pulmonary neg pulmonary ROS,    Pulmonary exam normal        Cardiovascular negative cardio ROS Normal cardiovascular exam     Neuro/Psych negative neurological ROS  negative psych ROS   GI/Hepatic Neg liver ROS, hiatal hernia, GERD  ,  Endo/Other  negative endocrine ROSMorbid obesity  Renal/GU   negative genitourinary   Musculoskeletal negative musculoskeletal ROS (+)   Abdominal   Peds negative pediatric ROS (+)  Hematology negative hematology ROS (+)   Anesthesia Other Findings   Reproductive/Obstetrics negative OB ROS                             Anesthesia Physical Anesthesia Plan  ASA: II  Anesthesia Plan: General   Post-op Pain Management:    Induction: Intravenous  Airway Management Planned: LMA  Additional Equipment:   Intra-op Plan:   Post-operative Plan: Extubation in OR  Informed Consent: I have reviewed the patients History and Physical, chart, labs and discussed the procedure including the risks, benefits and alternatives for the proposed anesthesia with the patient or authorized representative who has indicated his/her understanding and acceptance.   Dental advisory given  Plan Discussed with: CRNA, Anesthesiologist and Surgeon  Anesthesia Plan Comments:         Anesthesia Quick Evaluation

## 2015-11-25 NOTE — Transfer of Care (Signed)
Last Vitals:  Vitals:   11/25/15 1135  BP: (!) 143/90  Pulse: (!) 108  Resp: 16  Temp: 37.3 C    Last Pain:  Vitals:   11/25/15 1135  TempSrc: Oral      Patients Stated Pain Goal: 8 (11/25/15 1151)  Immediate Anesthesia Transfer of Care Note  Patient: Peter Mills  Procedure(s) Performed: Procedure(s) (LRB): CYSTOSCOPY WITH RETROGRADE PYELOGRAM, URETEROSCOPY AND STENT PLACEMENT (Bilateral) DORSAL SLIT (N/A) HOLMIUM LASER APPLICATION (Left) URETEROSCOPY DIAGNOSTIC (Right)  Patient Location: PACU  Anesthesia Type: General  Level of Consciousness: awake, alert  and oriented  Airway & Oxygen Therapy: Patient Spontanous Breathing and Patient connected to nasal cannula oxygen  Post-op Assessment: Report given to PACU RN and Post -op Vital signs reviewed and stable  Post vital signs: Reviewed and stable  Complications: No apparent anesthesia complications

## 2015-11-25 NOTE — Brief Op Note (Signed)
11/25/2015  1:33 PM  PATIENT:  Peter SewerMatthew C Kempen  45 y.o. male  PRE-OPERATIVE DIAGNOSIS:  LEFT URETERAL STONE, RIGHT RENAL STONE, PHIMOSIS  POST-OPERATIVE DIAGNOSIS:  left ureteral stone, right renal stone, phimosis  PROCEDURE:  Procedure(s): CYSTOSCOPY WITH RETROGRADE PYELOGRAM, URETEROSCOPY AND STENT PLACEMENT (Bilateral) DORSAL SLIT (N/A) HOLMIUM LASER APPLICATION (Left) URETEROSCOPY DIAGNOSTIC (Right)  SURGEON:  Surgeon(s) and Role:    * Sebastian Acheheodore Janye Maynor, MD - Primary  PHYSICIAN ASSISTANT:   ASSISTANTS: none   ANESTHESIA:   local and general  EBL:  Total I/O In: 1000 [I.V.:1000] Out: 10 [Blood:10]  BLOOD ADMINISTERED:none  DRAINS: none   LOCAL MEDICATIONS USED:  MARCAINE     SPECIMEN:  Source of Specimen:  left ureteral stone fragments  DISPOSITION OF SPECIMEN:  [facility] for compositional analysis  COUNTS:  YES  TOURNIQUET:  * No tourniquets in log *  DICTATION: .Other Dictation: Dictation Number [id number]  PLAN OF CARE: Discharge to home after PACU  PATIENT DISPOSITION:  PACU - hemodynamically stable.   Delay start of Pharmacological VTE agent (>24hrs) due to surgical blood loss or risk of bleeding: yes

## 2015-11-25 NOTE — Anesthesia Procedure Notes (Signed)
Procedure Name: LMA Insertion Date/Time: 11/25/2015 12:31 PM Performed by: Heather RobertsSINGER, JAMES Pre-anesthesia Checklist: Patient identified, Emergency Drugs available, Suction available and Patient being monitored Patient Re-evaluated:Patient Re-evaluated prior to inductionOxygen Delivery Method: Circle system utilized Preoxygenation: Pre-oxygenation with 100% oxygen Intubation Type: IV induction Ventilation: Mask ventilation without difficulty LMA: LMA inserted LMA Size: 5.0 Number of attempts: 1 Airway Equipment and Method: Bite block Placement Confirmation: positive ETCO2 Tube secured with: Tape Dental Injury: Teeth and Oropharynx as per pre-operative assessment

## 2015-11-25 NOTE — Discharge Instructions (Signed)
1 - You may have urinary urgency (bladder spasms) and bloody urine on / off with stent in place. This is normal.  2 - Remove tethered stents on Monday morning at home by pulling on string, then blue-white plastic tubing, and discarding. There are two of them. Office is open Monday if any issues arise.   3 - Stitches are dissolvable and will unravel / disappear over then next 3 weeks. You may shower immediately.   4 - Call MD or go to ER for fever >102, severe pain / nausea / vomiting not relieved by medications, or acute change in medical status  [facility] [phone] Post Ureteroscopy With or Without Stent Instructions  Definitions:  Ureter: The duct that transports urine from the kidney to the bladder. Stent:   A plastic hollow tube that is placed into the ureter, from the kidney to the                 bladder to prevent the ureter from swelling shut.  GENERAL INSTRUCTIONS:  Despite the fact that no skin incisions were used, the area around the ureter and bladder is raw and irritated. The stent is a foreign body which will further irritate the bladder wall. This irritation is manifested by increased frequency of urination, both day and night, and by an increase in the urge to urinate. In some, the urge to urinate is present almost always. Sometimes the urge is strong enough that you may not be able to stop yourself from urinating. The only real cure is to remove the stent and then give time for the bladder wall to heal which can't be done until the danger of the ureter swelling shut has passed, which varies.  You may see some blood in your urine while the stent is in place and a few days afterwards. Do not be alarmed, even if the urine was clear for a while. Get off your feet and drink lots of fluids until clearing occurs. If you start to pass clots or don't improve, call us.  DIET: You may return to your normal diet immediately. Because of the raw surface of your  bladder, alcohol, spicy foods, acid type foods and drinks with caffeine may cause irritation or frequency and should be used in moderation. To keep your urine flowing freely and to avoid constipation, drink plenty of fluids during the day ( 8-10 glasses ). Tip: Avoid cranberry juice because it is very acidic.  ACTIVITY: Your physical activity doesn't need to be restricted. However, if you are very active, you may see some blood in your urine. We suggest that you reduce your activity under these circumstances until the bleeding has stopped.  BOWELS: It is important to keep your bowels regular during the postoperative period. Straining with bowel movements can cause bleeding. A bowel movement every other day is reasonable. Use a mild laxative if needed, such as Milk of Magnesia 2-3 tablespoons, or 2 Dulcolax tablets. Call if you continue to have problems. If you have been taking narcotics for pain, before, during or after your surgery, you may be constipated. Take a laxative if necessary.   MEDICATION: You should resume your pre-surgery medications unless told not to. In addition you will often be given an antibiotic to prevent infection. These should be taken as prescribed until the bottles are finished unless you are having an unusual reaction to one of the drugs.  PROBLEMS YOU SHOULD REPORT TO [place]:  Fevers over 100.5 Fahrenheit.  Heavy bleeding,  or clots ( See above notes about blood in urine ).  Inability to urinate.  Drug reactions ( hives, rash, nausea, vomiting, diarrhea ).  Severe burning or pain with urination that is not improving.  FOLLOW-UP: You will need a follow-up appointment to monitor your progress. Call for this appointment at the number listed above. Usually the first appointment will be about three to fourteen days after your surgery.     Post Anesthesia Home Care Instructions  Activity: Get plenty of rest for the remainder of the day. A responsible adult should  stay with you for 24 hours following the procedure.  For the next 24 hours, DO NOT: -Drive a car -Advertising copywriterperate machinery -Drink alcoholic beverages -Take any medication unless instructed by your physician -Make any legal decisions or sign important papers.  Meals: Start with liquid foods such as gelatin or soup. Progress to regular foods as tolerated. Avoid greasy, spicy, heavy foods. If nausea and/or vomiting occur, drink only clear liquids until the nausea and/or vomiting subsides. Call your physician if vomiting continues.  Special Instructions/Symptoms: Your throat may feel dry or sore from the anesthesia or the breathing tube placed in your throat during surgery. If this causes discomfort, gargle with warm salt water. The discomfort should disappear within 24 hours.  If you had a scopolamine patch placed behind your ear for the management of post- operative nausea and/or vomiting:  1. The medication in the patch is effective for 72 hours, after which it should be removed.  Wrap patch in a tissue and discard in the trash. Wash hands thoroughly with soap and water. 2. You may remove the patch earlier than 72 hours if you experience unpleasant side effects which may include dry mouth, dizziness or visual disturbances. 3. Avoid touching the patch. Wash your hands with soap and water after contact with the patch.

## 2015-11-25 NOTE — Anesthesia Postprocedure Evaluation (Signed)
Anesthesia Post Note  Patient: Danton SewerMatthew C Schmelter  Procedure(s) Performed: Procedure(s) (LRB): CYSTOSCOPY WITH RETROGRADE PYELOGRAM, URETEROSCOPY AND STENT PLACEMENT (Bilateral) DORSAL SLIT (N/A) HOLMIUM LASER APPLICATION (Left) URETEROSCOPY DIAGNOSTIC (Right)  Patient location during evaluation: PACU Anesthesia Type: General Level of consciousness: awake and alert and patient cooperative Pain management: pain level controlled Vital Signs Assessment: post-procedure vital signs reviewed and stable Respiratory status: spontaneous breathing and respiratory function stable Cardiovascular status: stable Anesthetic complications: no    Last Vitals:  Vitals:   11/25/15 1135 11/25/15 1345  BP: (!) 143/90 129/81  Pulse: (!) 108 (!) 106  Resp: 16 14  Temp: 37.3 C 36.9 C    Last Pain:  Vitals:   11/25/15 1135  TempSrc: Oral                 Trigo Winterbottom S

## 2015-11-26 ENCOUNTER — Encounter (HOSPITAL_BASED_OUTPATIENT_CLINIC_OR_DEPARTMENT_OTHER): Payer: Self-pay | Admitting: Urology

## 2015-11-26 NOTE — Op Note (Signed)
NAMMalachy Moan:  Mills, Peter Mills              ACCOUNT NO.:  [id number]  MEDICAL RECORD NO.:  [id number]  LOCATION:                                 FACILITY:  PHYSICIAN:  Sebastian Acheheodore Ozella Comins, MD     DATE OF BIRTH:  [birth year]  DATE OF PROCEDURE: 11/25/2015                              OPERATIVE REPORT   PREOPERATIVE DIAGNOSES:  Left ureteral stone.  Right renal stone. Phimosis.  POSTOPERATIVE DIAGNOSES:  Left ureteral stone.  Right renal stone. Phimosis.  PROCEDURES: 1. Cystoscopy with bilateral retrograde pyelograms and interpretation. 2. Left ureteroscopy with laser lithotripsy and stent placement. 3. Right diagnostic ureteroscopy with stent placement. 4. Preputioplasty. 5. Penile block.  ESTIMATED BLOOD LOSS:  Nil.  COMPLICATION:  None.  SPECIMEN:  Left ureteral stone fragments for compositional analysis.  FINDINGS: 1. Left mid ureteral stone with mild hydroureteronephrosis above this. 2. Complete resolution of all left-sided stone fragments larger than     1/3rd mm following laser lithotripsy and basket extraction. 3. Successful placement of left ureteral stent, proximal in the renal     pelvis and distal in the urinary bladder. 4. Unremarkable right retrograde pyelogram. 5. No accessible stones within the area of the right kidney and ureter     with systematic inspection of all calices x3. 6. Moderate phimosis due to constricting phimotic ring. 7. Complete resolution of phimotic ring following for preputioplasty. 8. Successful placement of right ureteral stent, proximal in the renal     pelvis and distal in the bladder.  INDICATION:  Peter Mills is a 45 year old gentleman with a history of progressive phimosis making hygiene more difficult, who was found on workup of acute left-sided flank pain to have a left midureteral stone at approximately 4 mm.  He also had what appeared to be a small right intrarenal stone at around 3 mm.  He was evaluated and options were discussed  including medical therapy alone versus ureteroscopy, bilateral versus shockwave lithotripsy for his stone disease.  His stone was not easily seen on plain film.  Therefore, it was felt that lithotripsy would not be a viable option.  Therefore, he wished to proceed with ureteroscopy with medical therapy prior.  We also discussed his phimosis, which was very bothersome to him.  He states that he did not want to lose his foreskin, but did desire addressing of this.  We discussed the option of circumcision versus dorsal slit versus preputioplasty, and he wished to proceed with the latter.  Informed consent was obtained and placed in the medical record.  PROCEDURE IN DETAIL:  The patient being Peter Mills, was verified. Procedure being cysto, bilateral ureteroscopic stone manipulation and preputioplasty was confirmed.  Procedure was carried out.  Time-out was performed.  Intravenous antibiotics were administered.  General LMA anesthesia was introduced.  The patient was placed into a low lithotomy position and sterile field was created by prepping and draping the penis, perineum and proximal thighs using iodine x3.  His phimotic ring was reduced, additional smegma removed from the area of the corona and additional prepping was performed of this area of the penis.  Next, cystourethroscopy was performed using a 23-French rigid cystoscope with offset lens.  Inspection of  the anterior and posterior urethra were unremarkable.  Inspection of the urinary bladder revealed no diverticula, calcifications, papillary lesions.  The left ureteral orifice was cannulated with a 6-French end-hole catheter and left retrograde pyelogram was obtained.  Left retrograde pyelogram demonstrated a single left ureter with single- system left kidney.  There was mild hydroureteronephrosis to a filling defect in the midureter consistent with known stone.  A 0.038 zip wire was advanced to the level of the upper pole,  set aside as a safety wire. Similarly, right retrograde pyelogram was obtained.  Right retrograde pyelogram demonstrated a single right ureter with single-system right kidney.  No filling defects or narrowing noted whatsoever.  A second zip wire was advanced at the level of the upper pole, set aside as a safety wire.  An 8-French feeding tube was then placed in the urinary bladder for pressure release.  Next, semi-rigid ureteroscopy was performed to the distal two-thirds of left ureter alongside a separate Sensor working wire.  No mucosal abnormalities were found.  The semi-rigid scope was exchanged for a 12/14 medium-length ureteral access sheath at the level of the midureter using continuous fluoroscopic guidance and flexible digital ureteroscopy was performed of the more proximal left ureter using a single-channel ureteroscope.  In the proximal third, there was an area of mild mucosal edema likely corresponding to site of prior stone impaction.  Inspection proximal to this revealed free floating proximal ureteral stone.  This was repositioned into an upper pole calyx for better visualization and it was felt that this would not be amenable to simple basketing.  As such, holmium laser energy applied to the stone using setting of 0.2 joules and 20 Hz fragmenting the stone in approximately three smaller pieces, which were then grasped on their long axis and removed in their entirety, set aside for compositional analysis.  Repeat panendoscopic examination of the left kidney revealed complete resolution of all stone fragments larger than 1/3rd mm.  The access sheath was removed under continuous vision, no significant mucosal abnormalities were found. Attention was then directed at the right side.  Semi-rigid ureteroscopy was performed to the distal third of the right ureter alongside a separate Sensor working wire, no mucosal abnormalities were seen.  It was exchanged for the medium-length  ureteral access sheath at the level of the midureter using continuous fluoroscopic guidance and flexible digital ureteroscopy was performed of the proximal right ureter and systematic inspection of the right kidney including all calices x3. There were no accessible free-floating stones within the accessible portions of the kidney whatsoever as well the prior calcifications likely corresponds to parenchymal versus vascular versus submucosal calcifications on the right side.  The access sheath was then removed under continuous vision on the right side.  No mucosal abnormalities were found.  Given the use of access sheath on bilateral procedure, certainly, it was felt that interval stenting would be warranted to prevent bilateral obstruction due to mucosal edema.  As such, a 5 x 26 Polaris-type stent was placed over the remaining safety wire on the right side using fluoroscopic guidance.  Good proximal and distal deployment were noted.  The tether was left in place.  Similarly, a 6 x 26 Contour-type stent was used on the left side as there were no more Polaris-type stents available.  It was placed also using fluoroscopic guidance.  Good proximal and distal deployment were noted.  The tether was left in place.  Attention was then directed at preputioplasty.  The area of maximally constriction  of the phimotic ring was identified and excision was made approximately 1.5 cm in length at a right angle to this, thus reducing the most dense portion of the phimotic ring.  The area was then closed in a Heineke-Mikulicz fashion, further releasing the phimotic ring.  This was performed using running 4-0 Vicryl. Following this, the foreskin was much more easily reduced and there was no foreskin excision with this maneuver whatsoever.  Penile block was then performed.  A 10 mL of 0.5% plain Marcaine was used to perform penile block, 5 mL in a ring block fashion at the base of the penis and additional 5  mL along the dorsal penile nerve just below the inferior pubic ramus.  Tethers were fashioned to the dorsum of the penis and procedure was terminated. The patient tolerated the procedure well.  There were no immediate periprocedural complications.  The patient was taken to the postanesthesia care unit in stable condition.    ______________________________ Sebastian Ache, MD   ______________________________ Sebastian Ache, MD    TM/MEDQ  D:  11/25/2015  T:  11/26/2015  Job:  [id number]

## 2015-12-20 ENCOUNTER — Encounter (HOSPITAL_BASED_OUTPATIENT_CLINIC_OR_DEPARTMENT_OTHER): Payer: Self-pay | Admitting: Urology

## 2016-12-22 DIAGNOSIS — H0014 Chalazion left upper eyelid: Secondary | ICD-10-CM | POA: Diagnosis not present

## 2016-12-22 DIAGNOSIS — H01003 Unspecified blepharitis right eye, unspecified eyelid: Secondary | ICD-10-CM | POA: Diagnosis not present

## 2017-02-26 DIAGNOSIS — J209 Acute bronchitis, unspecified: Secondary | ICD-10-CM | POA: Diagnosis not present

## 2017-05-08 DIAGNOSIS — M47816 Spondylosis without myelopathy or radiculopathy, lumbar region: Secondary | ICD-10-CM | POA: Diagnosis not present

## 2017-05-08 DIAGNOSIS — M5136 Other intervertebral disc degeneration, lumbar region: Secondary | ICD-10-CM | POA: Diagnosis not present

## 2017-05-08 DIAGNOSIS — M549 Dorsalgia, unspecified: Secondary | ICD-10-CM | POA: Diagnosis not present

## 2017-05-08 DIAGNOSIS — M546 Pain in thoracic spine: Secondary | ICD-10-CM | POA: Diagnosis not present

## 2017-05-08 DIAGNOSIS — M545 Low back pain: Secondary | ICD-10-CM | POA: Diagnosis not present

## 2017-05-14 DIAGNOSIS — Z23 Encounter for immunization: Secondary | ICD-10-CM | POA: Diagnosis not present

## 2017-07-23 DIAGNOSIS — L255 Unspecified contact dermatitis due to plants, except food: Secondary | ICD-10-CM | POA: Diagnosis not present

## 2017-11-02 DIAGNOSIS — L719 Rosacea, unspecified: Secondary | ICD-10-CM | POA: Diagnosis not present

## 2017-11-02 DIAGNOSIS — Z0000 Encounter for general adult medical examination without abnormal findings: Secondary | ICD-10-CM | POA: Diagnosis not present

## 2017-12-15 IMAGING — CT CT RENAL STONE PROTOCOL
2 of 4 series · 15 of 46 positions shown, 17 images · non-contrast
Comparison: None.

CLINICAL DATA: Acute onset of left flank pain, hematuria and
burning. Initial encounter.

EXAM:
CT ABDOMEN AND PELVIS WITHOUT CONTRAST
TECHNIQUE: Multidetector CT imaging of the abdomen and pelvis was performed
following the standard protocol without IV contrast.

[Series 2: renal stone 5mm · axial · 0.80mm/px · z∈[-1243,-733]mm · 12 of 112 slices shown, 14 images]
[im 5/112  soft-tissue]
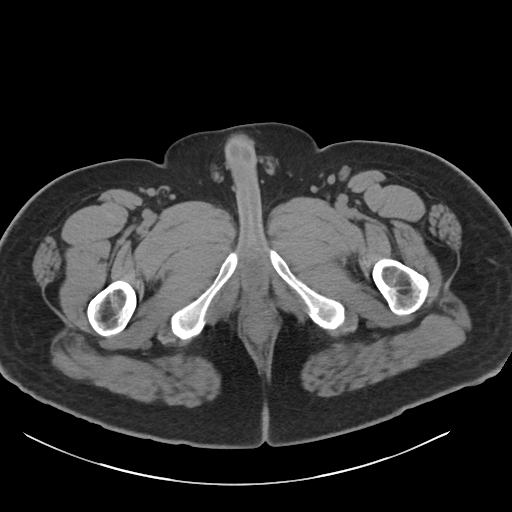
[im 5/112  bone]
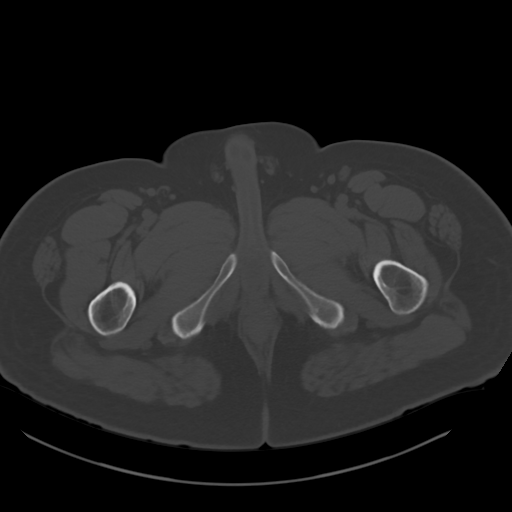
[im 14/112  soft-tissue]
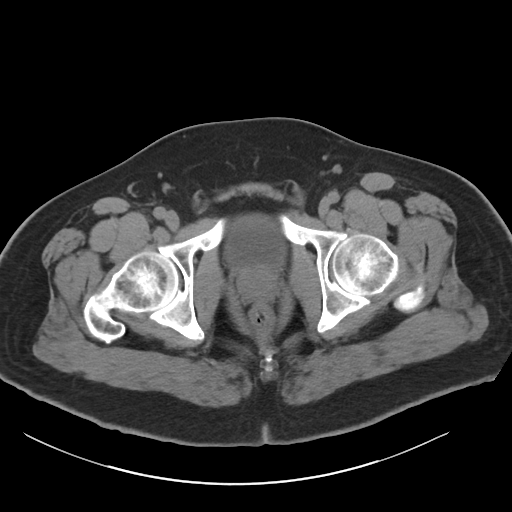
[im 24/112  soft-tissue]
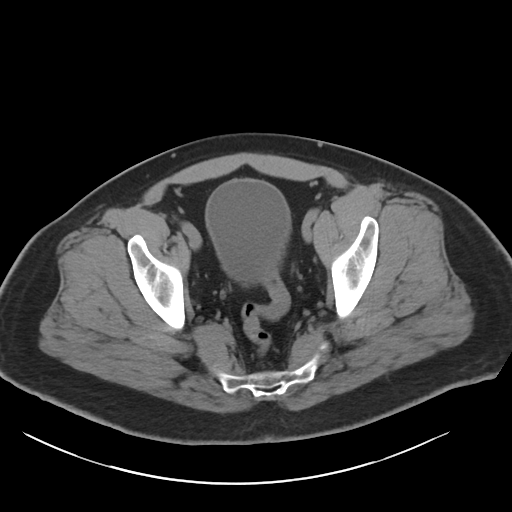
[im 33/112  soft-tissue]
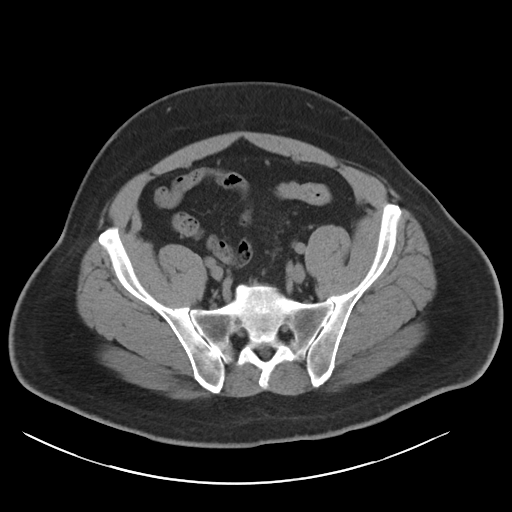
[im 42/112  soft-tissue]
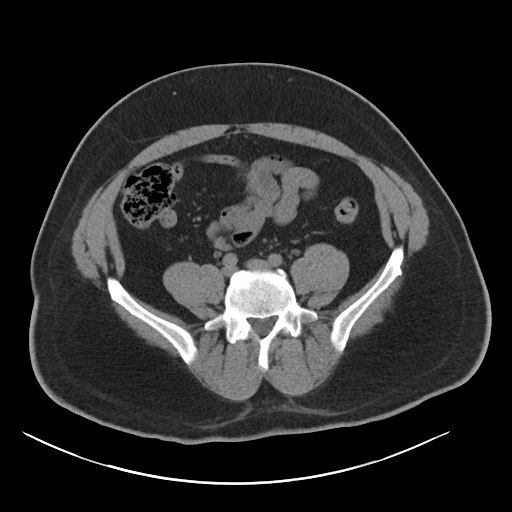
[im 51/112  soft-tissue]
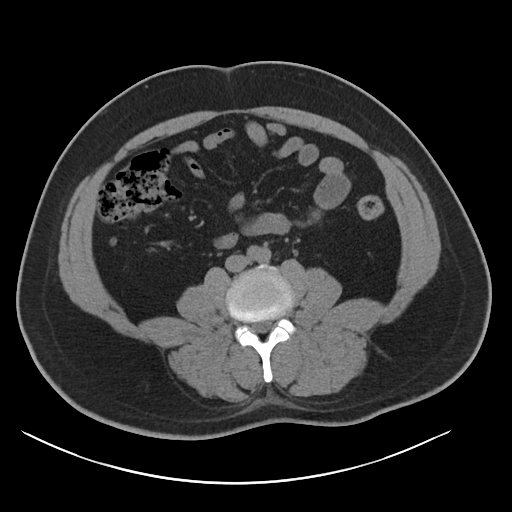
[im 61/112  soft-tissue]
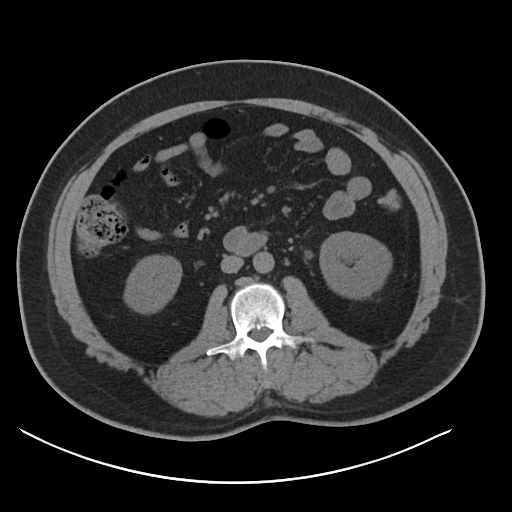
[im 70/112  soft-tissue]
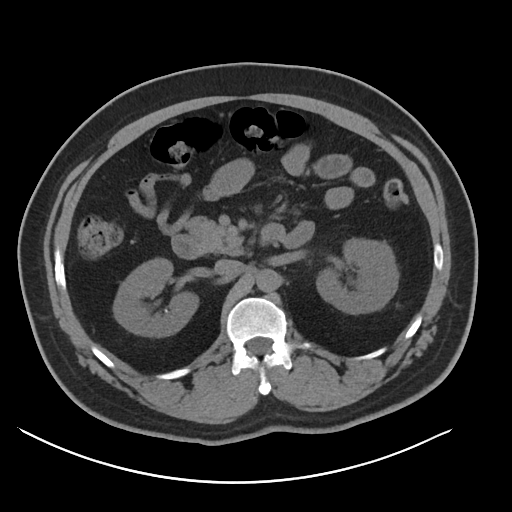
[im 79/112  soft-tissue]
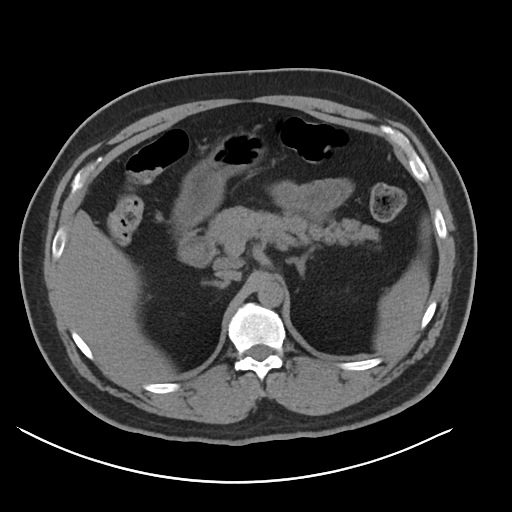
[im 79/112  bone]
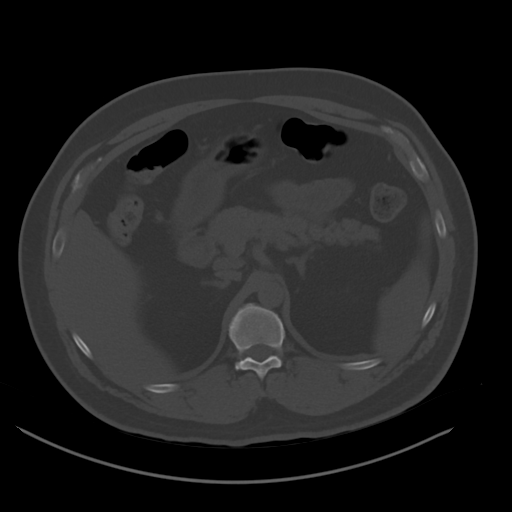
[im 88/112  soft-tissue]
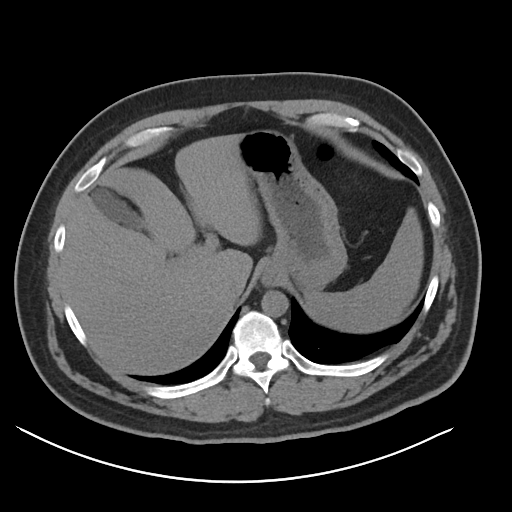
[im 98/112  soft-tissue]
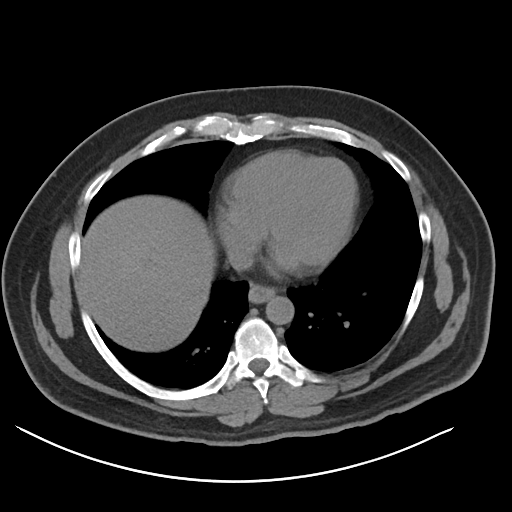
[im 107/112  soft-tissue]
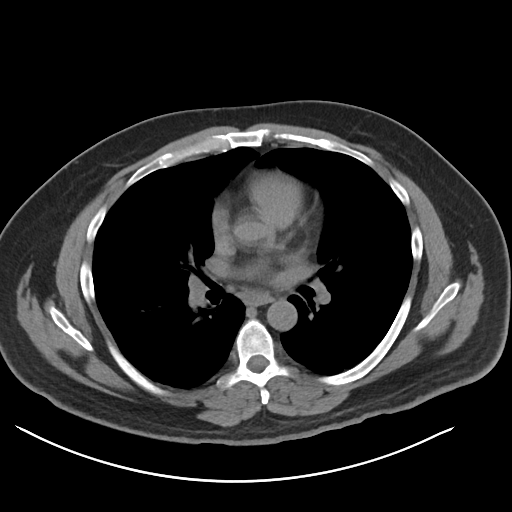

[Series 4: renal stone 3.0 cor · coronal · 0.75mm/px · 3 of 93 slices shown]
[im 31/93  soft-tissue]
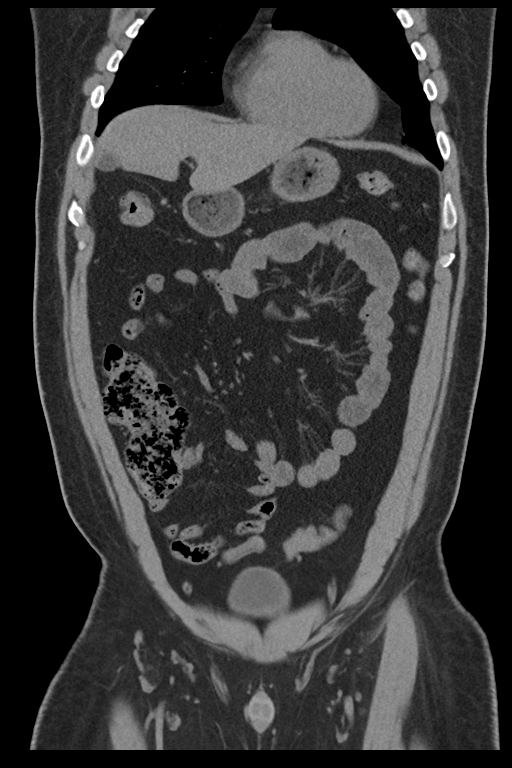
[im 41/93  soft-tissue]
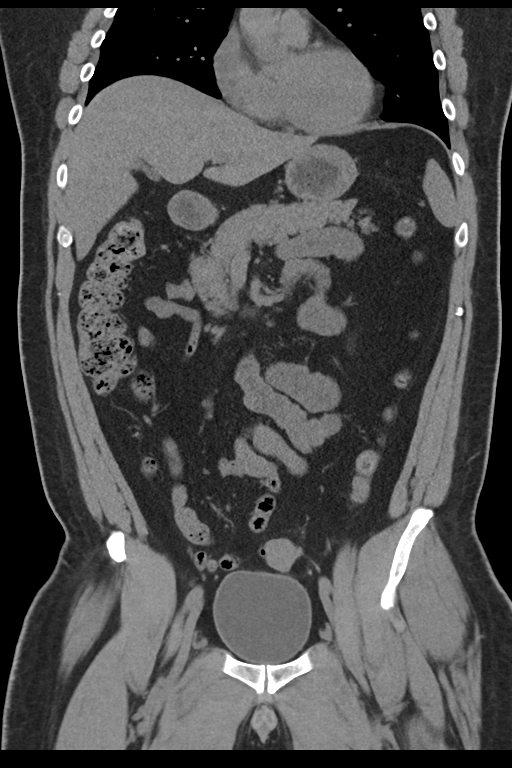
[im 52/93  soft-tissue]
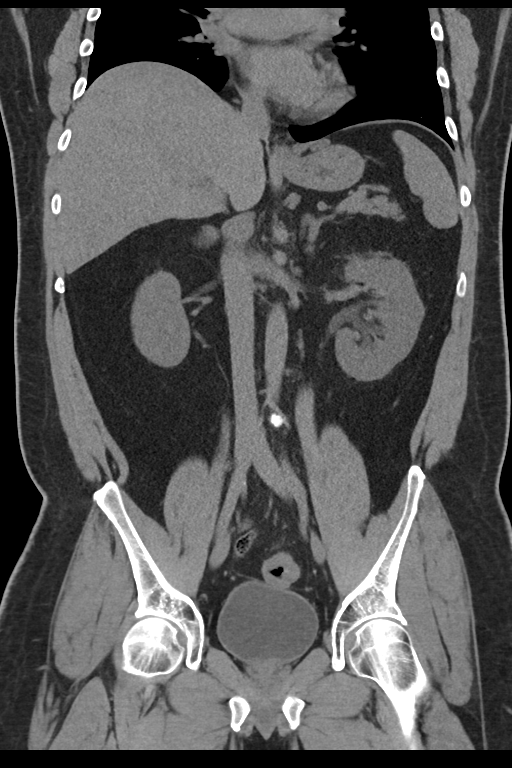

[15 of 46 positions shown; findings below may reference images not displayed]

FINDINGS: Lower chest: Mild bibasilar opacities likely reflect atelectasis.

Hepatobiliary: The liver is unremarkable in appearance. The
gallbladder is unremarkable in appearance. The common bile duct
remains normal in caliber.

Pancreas: The pancreas is within normal limits.

Spleen: The spleen is mildly enlarged, measuring 13.7 cm in length.

Adrenals/Urinary Tract: The adrenal glands are unremarkable in
appearance.

Minimal left-sided hydronephrosis is noted, with left-sided
perinephric stranding, and an obstructing 7 x 4 mm stone at the
proximal left ureter, 3 cm below the left renal pelvis.

A nonobstructing 4 mm stone is noted at the interpole region of the
right kidney. Minimal right-sided perinephric stranding is seen.

Stomach/Bowel: The stomach is unremarkable in appearance. The small
bowel is within normal limits. The appendix is normal in caliber,
without evidence of appendicitis. The colon is unremarkable in
appearance.

Vascular/Lymphatic: The abdominal aorta is unremarkable in
appearance. The inferior vena cava is grossly unremarkable. No
retroperitoneal lymphadenopathy is seen. No pelvic sidewall
lymphadenopathy is identified.

Reproductive: The bladder is mildly distended and grossly
unremarkable. The prostate remains normal in size.

Other: No additional soft tissue abnormalities are seen.

Musculoskeletal: No acute osseous abnormalities are identified. The
visualized musculature is unremarkable in appearance.
IMPRESSION: 1. Minimal left-sided hydronephrosis, with an obstructing 7 x 4 mm
stone at the proximal left ureter, 3 cm below the left renal pelvis.
2. Nonobstructing 4 mm stone at the interpole region of the right
kidney.
3. Mild splenomegaly.
4. Mild bibasilar opacities likely reflect atelectasis.

## 2018-03-18 DIAGNOSIS — N50811 Right testicular pain: Secondary | ICD-10-CM | POA: Diagnosis not present

## 2018-03-18 DIAGNOSIS — Z9889 Other specified postprocedural states: Secondary | ICD-10-CM | POA: Diagnosis not present

## 2018-03-18 DIAGNOSIS — Z6837 Body mass index (BMI) 37.0-37.9, adult: Secondary | ICD-10-CM | POA: Diagnosis not present

## 2018-03-18 DIAGNOSIS — R1031 Right lower quadrant pain: Secondary | ICD-10-CM | POA: Diagnosis not present

## 2018-03-19 ENCOUNTER — Other Ambulatory Visit: Payer: Self-pay | Admitting: Nurse Practitioner

## 2018-03-19 DIAGNOSIS — R1084 Generalized abdominal pain: Secondary | ICD-10-CM

## 2018-03-19 DIAGNOSIS — R1031 Right lower quadrant pain: Secondary | ICD-10-CM

## 2018-03-21 ENCOUNTER — Ambulatory Visit
Admission: RE | Admit: 2018-03-21 | Discharge: 2018-03-21 | Disposition: A | Discharge status: Home / Self Care | Payer: BLUE CROSS/BLUE SHIELD | Source: Ambulatory Visit | Attending: Nurse Practitioner | Admitting: Nurse Practitioner

## 2018-03-21 DIAGNOSIS — N200 Calculus of kidney: Secondary | ICD-10-CM | POA: Diagnosis not present

## 2018-03-21 DIAGNOSIS — R1031 Right lower quadrant pain: Secondary | ICD-10-CM

## 2018-03-21 DIAGNOSIS — R1084 Generalized abdominal pain: Secondary | ICD-10-CM

## 2018-03-25 ENCOUNTER — Other Ambulatory Visit: Payer: BLUE CROSS/BLUE SHIELD

## 2018-03-26 DIAGNOSIS — N202 Calculus of kidney with calculus of ureter: Secondary | ICD-10-CM | POA: Diagnosis not present

## 2018-03-26 DIAGNOSIS — N471 Phimosis: Secondary | ICD-10-CM | POA: Diagnosis not present

## 2018-10-18 DIAGNOSIS — I10 Essential (primary) hypertension: Secondary | ICD-10-CM | POA: Diagnosis not present

## 2018-10-18 DIAGNOSIS — Z6836 Body mass index (BMI) 36.0-36.9, adult: Secondary | ICD-10-CM | POA: Diagnosis not present

## 2018-10-18 DIAGNOSIS — H02841 Edema of right upper eyelid: Secondary | ICD-10-CM | POA: Diagnosis not present

## 2020-04-21 IMAGING — CT CT ABD-PELV W/O
1 of 2 series · 14 of 32 positions shown, 19 images · non-contrast
Comparison: 11/14/2015.

CLINICAL DATA: Right flank and right lower quadrant abdominal pain.

EXAM:
CT ABDOMEN AND PELVIS WITHOUT CONTRAST
TECHNIQUE: Multidetector CT imaging of the abdomen and pelvis was performed
following the standard protocol without IV contrast.

[Series 2: abd/pelvis w/(date) · axial · 0.88mm/px · z∈[-725,-290]mm · 14 of 99 slices shown, 19 images]
[im 6/99  soft-tissue]
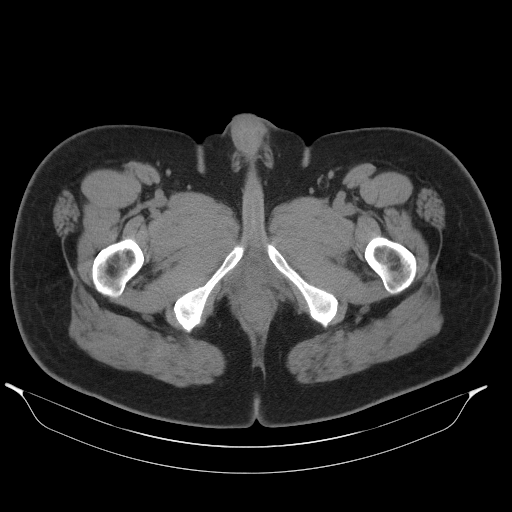
[im 6/99  bone]
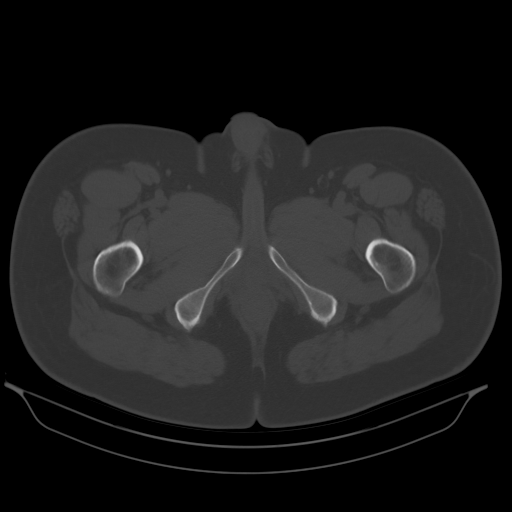
[im 16/99  soft-tissue]
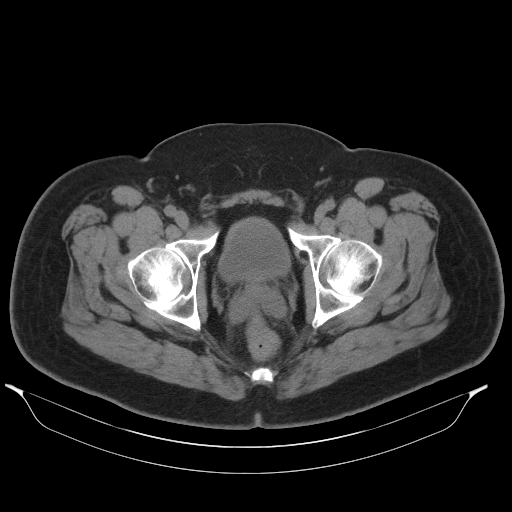
[im 21/99  soft-tissue]
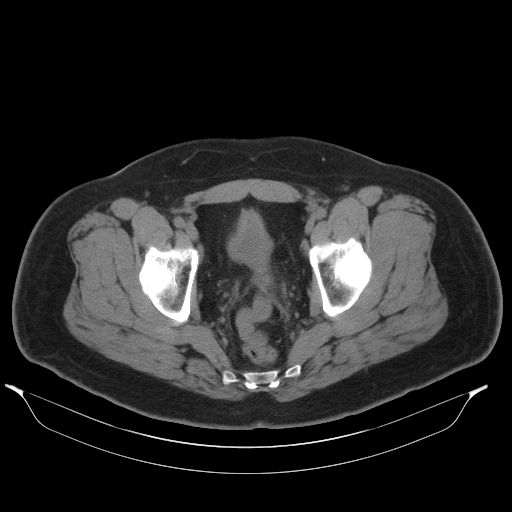
[im 26/99  soft-tissue]
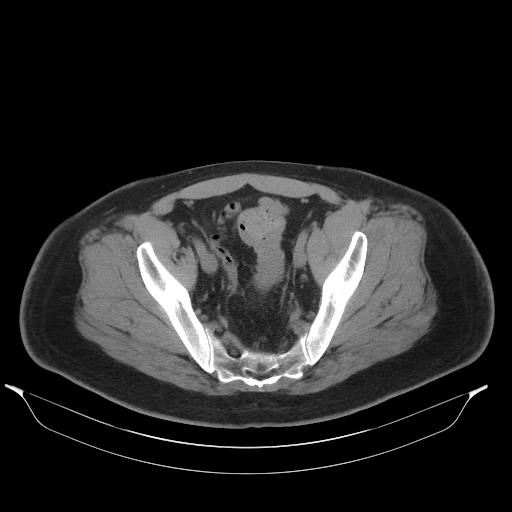
[im 37/99  soft-tissue]
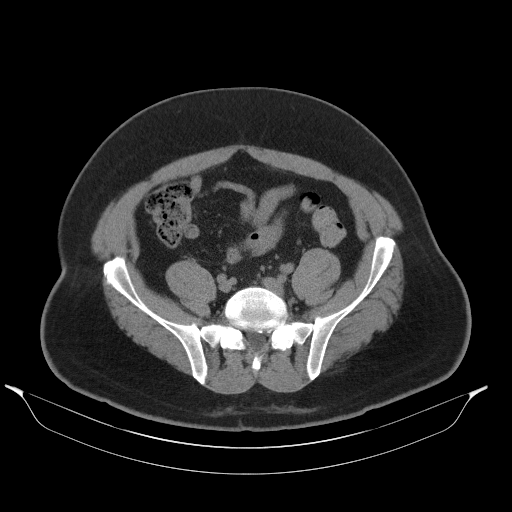
[im 42/99  soft-tissue]
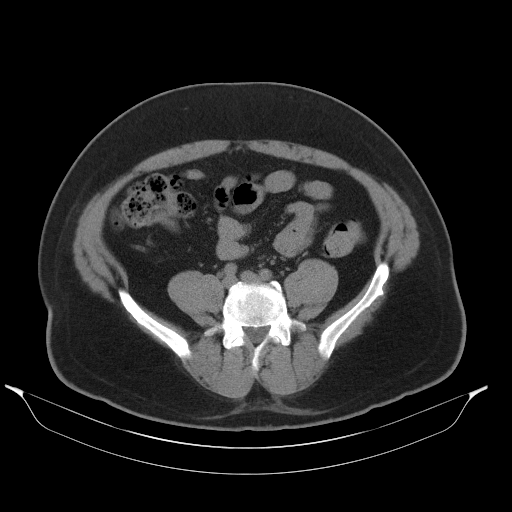
[im 52/99  soft-tissue]
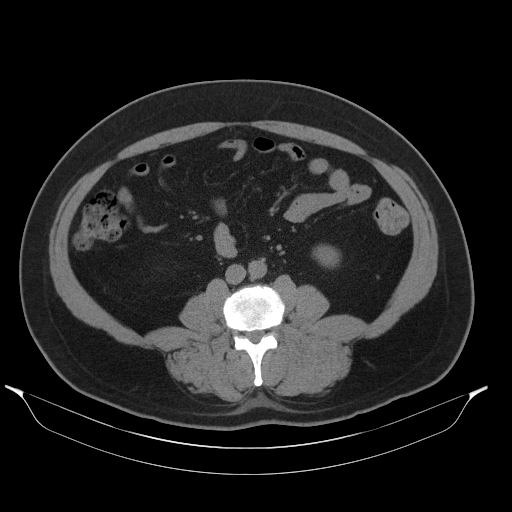
[im 57/99  soft-tissue]
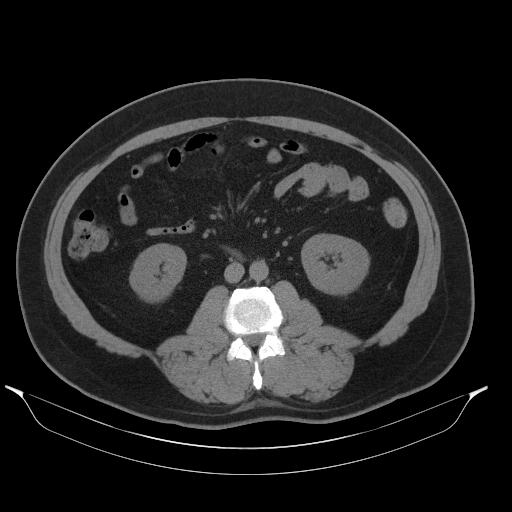
[im 62/99  soft-tissue]
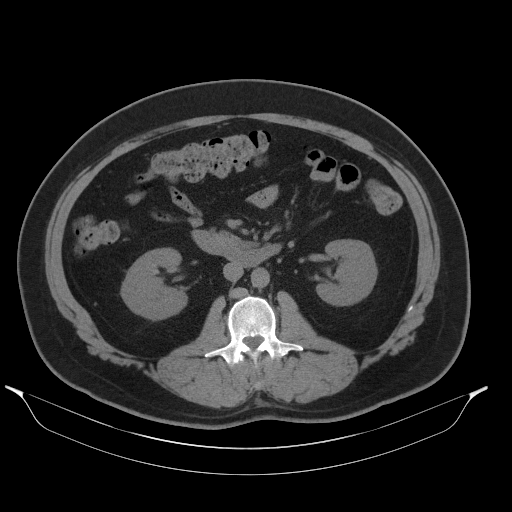
[im 62/99  bone]
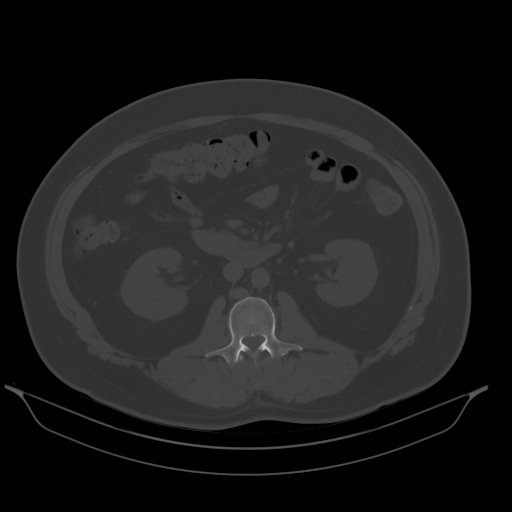
[im 73/99  soft-tissue]
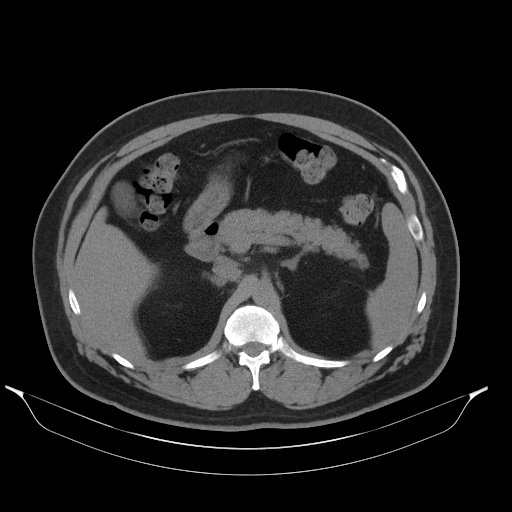
[im 78/99  soft-tissue]
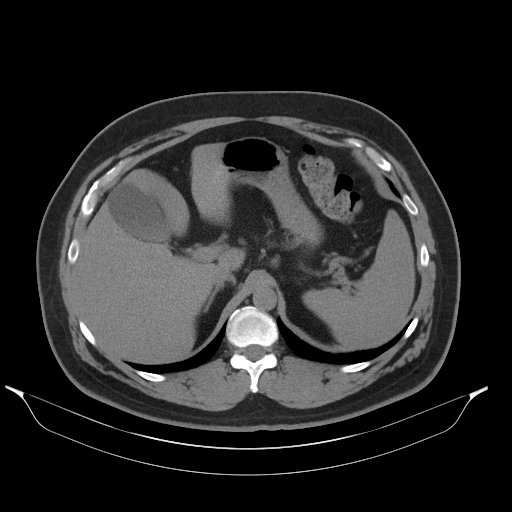
[im 78/99  lung]
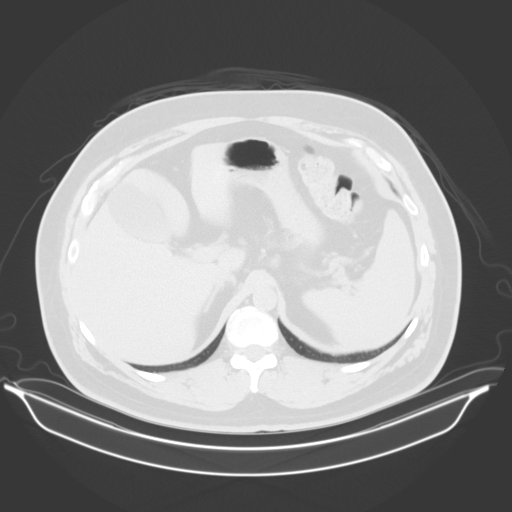
[im 83/99  soft-tissue]
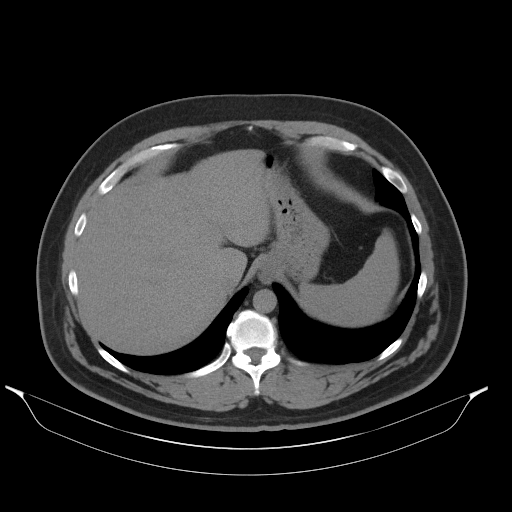
[im 83/99  lung]
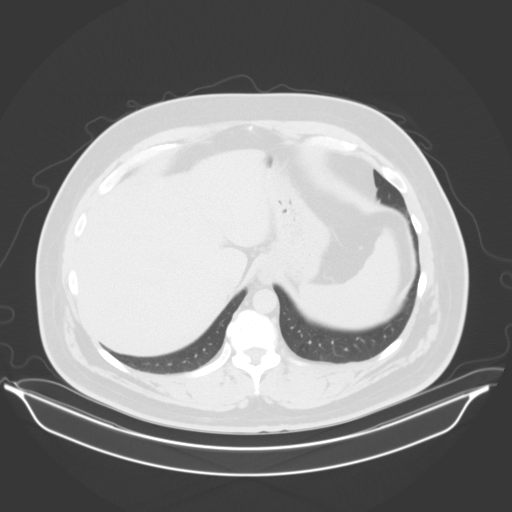
[im 88/99  lung]
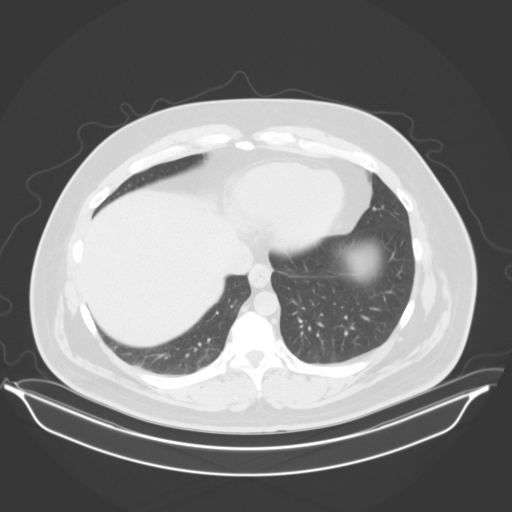
[im 93/99  soft-tissue]
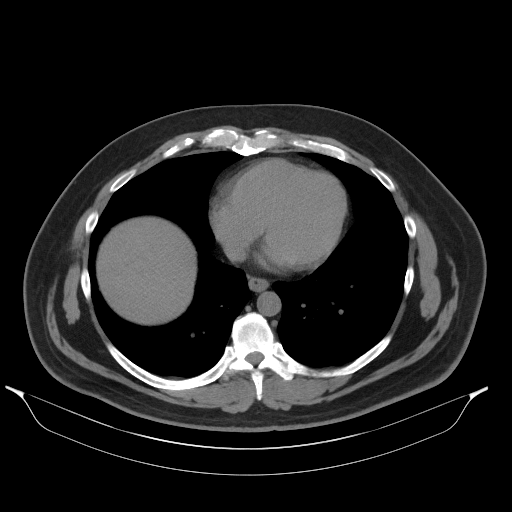
[im 93/99  lung]
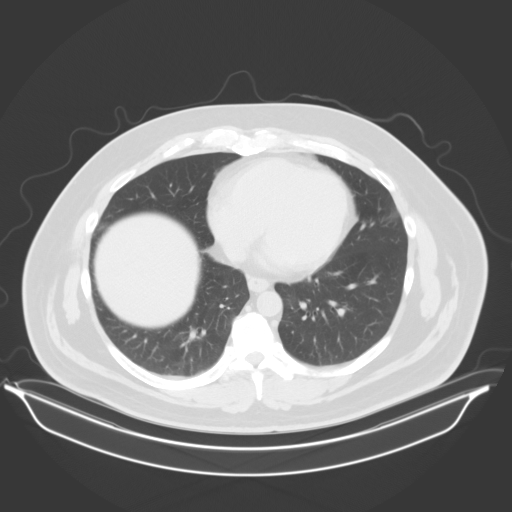

[14 of 32 positions shown; findings below may reference images not displayed]

FINDINGS: Lower chest: Small amount of patchy and linear atelectasis/scarring
at both lung bases.

Hepatobiliary: No focal liver abnormality is seen. No gallstones,
gallbladder wall thickening, or biliary dilatation.

Pancreas: Unremarkable. No pancreatic ductal dilatation or
surrounding inflammatory changes.

Spleen: Normal in size without focal abnormality.

Adrenals/Urinary Tract: Normal appearing adrenal glands. 4 mm upper
pole right renal calculus. Normal appearing left kidney, ureters and
urinary bladder.

Stomach/Bowel: Poorly distended distal sigmoid colon with diffuse
wall thickening measuring 1.4 cm in maximum thickness. Single tiny
sigmoid colon diverticulum. Normal appearing retrocecal appendix.
Normal appearing stomach and small bowel.

Vascular/Lymphatic: No significant vascular findings are present. No
enlarged abdominal or pelvic lymph nodes.

Reproductive: Prostate is unremarkable.

Other: No abdominal wall hernia or abnormality. No abdominopelvic
ascites.

Musculoskeletal: Lumbar and lower thoracic spine degenerative
changes. Multiple small pelvic bone islands.
IMPRESSION: 1. Poorly distended distal sigmoid colon with diffuse wall
thickening. The wall thickening could be normal due to lack of
distention of that portion of the colon. However, an underlying mass
can not be excluded. If this is a concern clinically, correlation
with sigmoidoscopy or colonoscopy would be recommended.
2. 4 mm nonobstructing upper pole right renal calculus.

## 2022-07-06 ENCOUNTER — Other Ambulatory Visit: Payer: Self-pay | Admitting: Family Medicine

## 2022-07-06 DIAGNOSIS — R911 Solitary pulmonary nodule: Secondary | ICD-10-CM

## 2022-08-03 ENCOUNTER — Ambulatory Visit: Payer: No Typology Code available for payment source | Admitting: Pulmonary Disease

## 2022-08-03 ENCOUNTER — Encounter: Payer: Self-pay | Admitting: Pulmonary Disease

## 2022-08-03 VITALS — BP 134/82 | HR 102 | Ht 68.0 in | Wt 234.0 lb

## 2022-08-03 DIAGNOSIS — J4531 Mild persistent asthma with (acute) exacerbation: Secondary | ICD-10-CM

## 2022-08-03 DIAGNOSIS — K219 Gastro-esophageal reflux disease without esophagitis: Secondary | ICD-10-CM | POA: Diagnosis not present

## 2022-08-03 LAB — CBC WITH DIFFERENTIAL/PLATELET
Basophils Absolute: 0.1 10*3/uL (ref 0.0–0.1)
Basophils Relative: 0.8 % (ref 0.0–3.0)
Eosinophils Absolute: 1.4 10*3/uL — ABNORMAL HIGH (ref 0.0–0.7)
Eosinophils Relative: 12.5 % — ABNORMAL HIGH (ref 0.0–5.0)
HCT: 50.1 % (ref 39.0–52.0)
Hemoglobin: 16.5 g/dL (ref 13.0–17.0)
Lymphocytes Relative: 26 % (ref 12.0–46.0)
Lymphs Abs: 2.8 10*3/uL (ref 0.7–4.0)
MCHC: 32.9 g/dL (ref 30.0–36.0)
MCV: 86.7 fl (ref 78.0–100.0)
Monocytes Absolute: 0.7 10*3/uL (ref 0.1–1.0)
Monocytes Relative: 6.1 % (ref 3.0–12.0)
Neutro Abs: 6 10*3/uL (ref 1.4–7.7)
Neutrophils Relative %: 54.6 % (ref 43.0–77.0)
Platelets: 320 10*3/uL (ref 150.0–400.0)
RBC: 5.78 Mil/uL (ref 4.22–5.81)
RDW: 13.7 % (ref 11.5–15.5)
WBC: 10.9 10*3/uL — ABNORMAL HIGH (ref 4.0–10.5)

## 2022-08-03 MED ORDER — FLUTICASONE-SALMETEROL 250-50 MCG/ACT IN AEPB: 1.0000 | INHALATION_SPRAY | Freq: Two times a day (BID) | RESPIRATORY_TRACT | 5 refills | Status: DC

## 2022-08-03 MED ORDER — LEVALBUTEROL TARTRATE 45 MCG/ACT IN AERO: 2.0000 | INHALATION_SPRAY | Freq: Four times a day (QID) | RESPIRATORY_TRACT | 5 refills | Status: AC | PRN

## 2022-08-03 MED ORDER — FAMOTIDINE 20 MG PO TABS: 20.0000 mg | ORAL_TABLET | Freq: Every day | ORAL | 0 refills | Status: AC

## 2022-08-03 MED ORDER — PREDNISONE 10 MG PO TABS: ORAL_TABLET | ORAL | 0 refills | Status: AC

## 2022-08-03 NOTE — Progress Notes (Signed)
Synopsis: Referred in June 2024 for wheezing by Marijean Heath, MD  Subjective:   PATIENT ID: Peter Mills GENDER: male DOB: 25-Dec-1970, MRN: [id number]  HPI  Chief Complaint  Patient presents with   Consult    Referred by PCP for increased wheezing and coughing since November 2023. Productive cough with white phlegm.    Peter Mills is a 52 year old male, never smoker with GERD and hiatal hernia who is referred to pulmonary clinic for wheezing.  He reports having wheezing since 12/2021 after having a tooth removed with abscess that lead to his sinus being pierced by the tooth and has noted drainage of blood and pus down the back of his throat. He has shortness of breath with the wheezing. He notices the wheezing when laying down. He has dyspnea with climbing stairs.   He is having CT Chest scan done next Wednesday   No sinus pain or pressure. Mainly chest congestion. He does have intermittent sinus congestion and drainage. Intermittent reflux symptoms.   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   He had second hand smoke exposure in childhood.  No smoking currently. 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   Social History   Socioeconomic History   Marital status: Single    Spouse name: Not on file   Number of children: Not on file   Years of education: Not on file   Highest education level: Not on file  Occupational History   Not on file  Tobacco Use   Smoking status: Never   Smokeless tobacco: Never  Substance and Sexual Activity   Alcohol use:  Yes    Comment: 1 PER MONTH   Drug use: No   Sexual activity: Not on file  Other Topics Concern   Not on file  Social History Narrative   Not on file   Social Determinants of Health   Financial Resource Strain: Not on file  Food Insecurity: Not on file  Transportation Needs: Not on file  Physical Activity: Not on file  Stress: Not on file  Social Connections: Not on file  Intimate Partner Violence: Not on file     Allergies  Allergen Reactions   Codeine Nausea And Vomiting   Hydrocodone Nausea And Vomiting     Outpatient Medications Prior to Visit  Medication Sig Dispense Refill   calcium carbonate (TUMS - DOSED IN MG ELEMENTAL CALCIUM) 500 MG chewable tablet Chew 1 tablet by mouth as needed for indigestion or heartburn.     levalbuterol (XOPENEX HFA) 45 MCG/ACT inhaler Inhale 2 puffs into the lungs every 6 (six) hours as needed for wheezing or shortness of breath.     cephALEXin (KEFLEX) 500 MG capsule Take 1 capsule (500 mg total) by mouth 2 (two) times daily. X 4 days to prevent post-op infection with stents in place 8 capsule 0   doxycycline (VIBRAMYCIN) 50 MG capsule Take 50 mg by mouth daily.  ketorolac (TORADOL) 10 MG tablet Take 1 tablet (10 mg total) by mouth every 6 (six) hours as needed for moderate pain. Post-operatively 20 tablet 1   Multiple Vitamins-Minerals (MULTIVITAMIN GUMMIES ADULT) CHEW Chew by mouth daily.     Omega-3 Fatty Acids (FISH OIL) 1000 MG CAPS Take by mouth daily.     ondansetron (ZOFRAN ODT) 4 MG disintegrating tablet 4mg  ODT q4 hours prn nausea/vomit 15 tablet 0   oxyCODONE (ROXICODONE) 5 MG immediate release tablet Take 1-2 tablets (5-10 mg total) by mouth every 6 (six) hours as needed for severe pain. Post-operatively 20 tablet 0   tamsulosin (FLOMAX) 0.4 MG CAPS capsule Take 1 capsule (0.4 mg total) by mouth 2 (two) times daily. 10 capsule 0   No facility-administered medications prior to visit.   Review of Systems  Constitutional:   Negative for chills, fever, malaise/fatigue and weight loss.  HENT:  Positive for congestion. Negative for sinus pain and sore throat.   Eyes: Negative.   Respiratory:  Positive for cough, shortness of breath and wheezing. Negative for hemoptysis and sputum production.   Cardiovascular:  Negative for chest pain, palpitations, orthopnea, claudication and leg swelling.  Gastrointestinal:  Positive for heartburn. Negative for abdominal pain, nausea and vomiting.  Genitourinary: Negative.   Musculoskeletal:  Negative for joint pain and myalgias.  Skin:  Negative for rash.  Neurological:  Negative for weakness.  Endo/Heme/Allergies: Negative.   Psychiatric/Behavioral: Negative.     Objective:   Vitals:   08/03/22 1418  BP: 134/82  Pulse: (!) 102  SpO2: 97%  Weight: 234 lb (106.1 kg)  Height: 5\' 8"  (1.727 m)     Physical Exam Constitutional:      General: He is not in acute distress. HENT:     Head: Normocephalic and atraumatic.  Eyes:     Conjunctiva/sclera: Conjunctivae normal.  Cardiovascular:     Rate and Rhythm: Normal rate and regular rhythm.     Pulses: Normal pulses.     Heart sounds: Normal heart sounds. No murmur heard. Pulmonary:     Breath sounds: Wheezing (inspiratory and expiratory) and rhonchi present. No rales.     Comments: Upper airway wheezing Musculoskeletal:     Right lower leg: No edema.     Left lower leg: No edema.  Lymphadenopathy:     Cervical: No cervical adenopathy.  Skin:    General: Skin is warm and dry.  Neurological:     General: No focal deficit present.     Mental Status: He is alert.     CBC    Component Value Date/Time   WBC 18.4 (H) 11/13/2015 2358   RBC 5.35 11/13/2015 2358   HGB 16.7 11/25/2015 1210   HCT 49.0 11/25/2015 1210   PLT 317 11/13/2015 2358   MCV 86.5 11/13/2015 2358   MCH 28.6 11/13/2015 2358   MCHC 33.0 11/13/2015 2358   RDW 13.7 11/13/2015 2358   LYMPHSABS 1.8 11/13/2015 2358   MONOABS 0.7 11/13/2015 2358    EOSABS 0.0 11/13/2015 2358   BASOSABS 0.0 11/13/2015 2358      Latest Ref Rng & Units 11/25/2015   12:10 PM 11/13/2015   11:58 PM  BMP  Glucose 65 - 99 mg/dL 161  096   BUN 6 - 20 mg/dL 13  14   Creatinine 0.45 - 1.24 mg/dL 4.09  8.11   Sodium 914 - 145 mmol/L 142  135   Potassium 3.5 - 5.1 mmol/L 3.6  3.3   Chloride 101 -  111 mmol/L 104  106   CO2 22 - 32 mmol/L  21   Calcium 8.9 - 10.3 mg/dL  9.2    Chest imaging:  PFT:     No data to display          Labs:  Path:  Echo:  Heart Catheterization:       Assessment & Plan:   Mild persistent reactive airway disease with acute exacerbation - Plan: fluticasone-salmeterol (ADVAIR) 250-50 MCG/ACT AEPB, predniSONE (DELTASONE) 10 MG tablet, levalbuterol (XOPENEX HFA) 45 MCG/ACT inhaler, CBC with Differential/Platelet, IgE  Gastroesophageal reflux disease without esophagitis - Plan: famotidine (PEPCID) 20 MG tablet  Discussion: Peter Mills is a 52 year old male, never smoker with GERD and hiatal hernia who is referred to pulmonary clinic for wheezing.  He has reactive airways disease after his dental procedure last fall with on going wheezing on exam today.   He is to start prednisone taper for 12 days and advair diskus 250-40mcg 1 puff twice daily.  Start famotidine 20mg  at bedtime for possible GERD  We will check IgE levels and CBC with diff today.  We will follow up his CT Chest scan scheduled for next week.  Follow up in 2 months with PFTs.  Melody Comas, MD [facility] Office: [phone]   Current Outpatient Medications:    calcium carbonate (TUMS - DOSED IN MG ELEMENTAL CALCIUM) 500 MG chewable tablet, Chew 1 tablet by mouth as needed for indigestion or heartburn., Disp: , Rfl:    famotidine (PEPCID) 20 MG tablet, Take 1 tablet (20 mg total) by mouth at bedtime., Disp: 30 tablet, Rfl: 0   fluticasone-salmeterol (ADVAIR) 250-50 MCG/ACT AEPB, Inhale 1 puff into the lungs in  the morning and at bedtime., Disp: 60 each, Rfl: 5   predniSONE (DELTASONE) 10 MG tablet, Take 4 tablets (40 mg total) by mouth daily with breakfast for 3 days, THEN 3 tablets (30 mg total) daily with breakfast for 3 days, THEN 2 tablets (20 mg total) daily with breakfast for 3 days, THEN 1 tablet (10 mg total) daily with breakfast for 3 days., Disp: 30 tablet, Rfl: 0   levalbuterol (XOPENEX HFA) 45 MCG/ACT inhaler, Inhale 2 puffs into the lungs every 6 (six) hours as needed for wheezing or shortness of breath., Disp: 1 each, Rfl: 5

## 2022-08-03 NOTE — Patient Instructions (Addendum)
Start advair 250-1mcg 1 puff twice daily - rinse mouth out after each use  Start prednisone taper: 40mg  daily x 3 days 30mg  daily x 3 days 20mg  daily x 3 days 10mg  daily x 3 days  We will follow up your CT Chest scan from next week  We will check labs today  Start famotidine 20mg  at bed time  Follow up in 2 months with pulmonary function tests

## 2022-08-04 LAB — IGE: IgE (Immunoglobulin E), Serum: 232 kU/L — ABNORMAL HIGH (ref <=114)

## 2022-08-09 ENCOUNTER — Ambulatory Visit
Admission: RE | Admit: 2022-08-09 | Discharge: 2022-08-09 | Disposition: A | Discharge status: Home / Self Care | Payer: No Typology Code available for payment source | Source: Ambulatory Visit | Attending: Family Medicine | Admitting: Family Medicine

## 2022-08-09 DIAGNOSIS — R911 Solitary pulmonary nodule: Secondary | ICD-10-CM

## 2022-08-11 ENCOUNTER — Encounter: Payer: Self-pay | Admitting: Pulmonary Disease

## 2022-08-11 NOTE — Telephone Encounter (Signed)
Dr. Francine Graven, please advise, thanks!  Hi there,  You advised if symptoms return when going down on dosage of Prednisone to advise.  I'm much better but started to get hoarse as went down to 3 then 2 pills a day.  I've been gargling after Wixela use but suppose could be related to that or possibly it's due to reduced Prednisone dose.  Would it be possible to extend out Prednisone and/or add something in case it is thrush starting up.  Have had thrush before yrs ago and don't want it while recovering from this too.   If prescribing ok to use same store [facility] [place].  Thanks!

## 2022-10-05 ENCOUNTER — Other Ambulatory Visit: Payer: Self-pay

## 2022-10-05 DIAGNOSIS — J4531 Mild persistent asthma with (acute) exacerbation: Secondary | ICD-10-CM

## 2022-11-07 ENCOUNTER — Encounter: Payer: Self-pay | Admitting: Pulmonary Disease

## 2022-11-07 DIAGNOSIS — J4531 Mild persistent asthma with (acute) exacerbation: Secondary | ICD-10-CM

## 2022-11-08 MED ORDER — WIXELA INHUB 250-50 MCG/ACT IN AEPB: 1.0000 | INHALATION_SPRAY | Freq: Two times a day (BID) | RESPIRATORY_TRACT | 0 refills | Status: DC

## 2022-11-13 ENCOUNTER — Ambulatory Visit: Payer: No Typology Code available for payment source | Admitting: Pulmonary Disease

## 2022-11-13 ENCOUNTER — Encounter: Payer: Self-pay | Admitting: Pulmonary Disease

## 2022-11-13 VITALS — BP 120/88 | HR 121 | Ht 68.0 in | Wt 248.0 lb

## 2022-11-13 DIAGNOSIS — J4520 Mild intermittent asthma, uncomplicated: Secondary | ICD-10-CM

## 2022-11-13 MED ORDER — FLUTICASONE-SALMETEROL 250-50 MCG/ACT IN AEPB: 1.0000 | INHALATION_SPRAY | Freq: Two times a day (BID) | RESPIRATORY_TRACT | 3 refills | Status: DC

## 2022-11-13 NOTE — Patient Instructions (Addendum)
Continue wixela 250-37mcg 1 puff twice daily - rinse mouth out after each use  Use levalbuterol inhaler 1-2 puffs every 4-6 hours  Follow up in 6 months with breathing tests

## 2022-11-20 ENCOUNTER — Encounter: Payer: Self-pay | Admitting: Pulmonary Disease

## 2022-11-27 MED ORDER — FLUTICASONE-SALMETEROL 250-50 MCG/ACT IN AEPB: 1.0000 | INHALATION_SPRAY | Freq: Two times a day (BID) | RESPIRATORY_TRACT | 3 refills | Status: DC

## 2022-11-27 NOTE — Addendum Note (Signed)
Addended by: Bonney Leitz on: 11/27/2022 06:07 PM   Modules accepted: Orders

## 2023-03-27 ENCOUNTER — Encounter: Payer: Self-pay | Admitting: Pulmonary Disease

## 2023-03-30 ENCOUNTER — Other Ambulatory Visit (HOSPITAL_BASED_OUTPATIENT_CLINIC_OR_DEPARTMENT_OTHER): Payer: Self-pay

## 2023-03-30 MED ORDER — FLUTICASONE-SALMETEROL 250-50 MCG/ACT IN AEPB: 1.0000 | INHALATION_SPRAY | Freq: Two times a day (BID) | RESPIRATORY_TRACT | 3 refills | Status: DC

## 2023-06-14 ENCOUNTER — Ambulatory Visit: Payer: No Typology Code available for payment source | Admitting: Pulmonary Disease

## 2023-06-14 ENCOUNTER — Other Ambulatory Visit (HOSPITAL_BASED_OUTPATIENT_CLINIC_OR_DEPARTMENT_OTHER): Payer: Self-pay

## 2023-06-14 ENCOUNTER — Encounter: Payer: Self-pay | Admitting: Pulmonary Disease

## 2023-06-14 VITALS — BP 128/80 | HR 83 | Ht 67.5 in | Wt 245.0 lb

## 2023-06-14 DIAGNOSIS — J4520 Mild intermittent asthma, uncomplicated: Secondary | ICD-10-CM

## 2023-06-14 DIAGNOSIS — R0683 Snoring: Secondary | ICD-10-CM

## 2023-06-14 DIAGNOSIS — Z6837 Body mass index (BMI) 37.0-37.9, adult: Secondary | ICD-10-CM

## 2023-06-14 DIAGNOSIS — E66812 Obesity, class 2: Secondary | ICD-10-CM | POA: Diagnosis not present

## 2023-06-14 LAB — PULMONARY FUNCTION TEST
DL/VA % pred: 118 %
DL/VA: 5.26 ml/min/mmHg/L
DLCO unc % pred: 107 %
DLCO unc: 28.85 ml/min/mmHg
FEF 25-75 Post: 1.65 L/s
FEF 25-75 Pre: 2.9 L/s
FEF2575-%Change-Post: -43 %
FEF2575-%Pred-Post: 52 %
FEF2575-%Pred-Pre: 92 %
FEV1-%Change-Post: -25 %
FEV1-%Pred-Post: 64 %
FEV1-%Pred-Pre: 86 %
FEV1-Post: 2.29 L
FEV1-Pre: 3.08 L
FEV1FVC-%Change-Post: -22 %
FEV1FVC-%Pred-Pre: 96 %
FEV6-%Change-Post: -5 %
FEV6-%Pred-Post: 88 %
FEV6-%Pred-Pre: 93 %
FEV6-Post: 3.9 L
FEV6-Pre: 4.11 L
FEV6FVC-%Pred-Post: 104 %
FEV6FVC-%Pred-Pre: 104 %
FVC-%Change-Post: -3 %
FVC-%Pred-Post: 85 %
FVC-%Pred-Pre: 89 %
FVC-Post: 3.94 L
FVC-Pre: 4.11 L
Post FEV1/FVC ratio: 58 %
Post FEV6/FVC ratio: 100 %
Pre FEV1/FVC ratio: 75 %
Pre FEV6/FVC Ratio: 100 %
RV % pred: 108 %
RV: 2.1 L
TLC % pred: 94 %
TLC: 6.12 L

## 2023-06-14 MED ORDER — FLUTICASONE-SALMETEROL 100-50 MCG/ACT IN AEPB: 1.0000 | INHALATION_SPRAY | Freq: Two times a day (BID) | RESPIRATORY_TRACT | 11 refills | Status: AC

## 2023-06-14 MED ORDER — FLUTICASONE-SALMETEROL 100-50 MCG/ACT IN AEPB: 1.0000 | INHALATION_SPRAY | Freq: Two times a day (BID) | RESPIRATORY_TRACT | 11 refills | Status: DC

## 2023-06-14 NOTE — Patient Instructions (Signed)
 Full pft performed today.

## 2023-06-14 NOTE — Progress Notes (Signed)
 Synopsis: Referred in June 2024 for wheezing by Peter Pang, MD  Subjective:   PATIENT ID: Peter Mills GENDER: male DOB: [birth year], MRN: [id number]  HPI  Chief Complaint  Patient presents with   Follow-up   Peter Mills is a 53 year old male, never smoker with GERD and hiatal hernia who is returns to pulmonary clinic for reactive airways disease.  Initial OV 08/03/22 He reports having wheezing since 12/2021 after having a tooth removed with abscess that lead to his sinus being pierced by the tooth and has noted drainage of blood and pus down the back of his throat. He has shortness of breath with the wheezing. He notices the wheezing when laying down. He has dyspnea with climbing stairs.   He is having CT Chest scan done next Wednesday   No sinus pain or pressure. Mainly chest congestion. He does have intermittent sinus congestion and drainage. Intermittent reflux symptoms.   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   He had second hand smoke exposure in childhood.  No smoking currently. 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  He has an occasional cough with white sputum.  Was started on Wixela 250-50 mcg 1 puff twice daily after last visit.  CT chest 08/09/2022 with unremarkable appearing lungs.  OV 06/14/23 He has been doing well since last visit. He reports he is now diabetic now and concerned if the steroid inhaler has led to any issues. He has not been using albuterol much.   He does report some snoring and dry mouth when sleeping.   Past Medical History:  Diagnosis Date   Frequency of urination    GERD (gastroesophageal reflux disease)    Hematuria    Hiatal hernia    Hydronephrosis, left    Left ureteral stone    Phimosis    Renal calculus, right    NON-OBSTRUCTIVE PER CT 11-14-2015    Rosacea, acne    Wears glasses      No family history on file.   Social History   Socioeconomic History   Marital status: Single    Spouse name: Not on file   Number of children: Not on file   Years of education: Not on file   Highest education level: Not on file  Occupational History   Not on file  Tobacco Use   Smoking status: Never   Smokeless tobacco: Never  Substance and Sexual Activity   Alcohol use: Yes    Comment: 1 PER MONTH   Drug use: No   Sexual activity: Not on file  Other Topics Concern   Not on file  Social History Narrative   Not on file   Social Drivers of Health   Financial Resource Strain: Not on file  Food Insecurity: Not on file  Transportation Needs: Not on file  Physical Activity: Not on file  Stress: Not on file  Social Connections: Not on file  Intimate Partner Violence: Not on file     Allergies  Allergen Reactions   Codeine Nausea And Vomiting   Hydrocodone Nausea And Vomiting     Outpatient Medications Prior to Visit  Medication Sig Dispense Refill   calcium carbonate (TUMS - DOSED IN MG ELEMENTAL CALCIUM) 500 MG chewable tablet Chew 1 tablet by mouth as needed for indigestion  or heartburn.     famotidine  (PEPCID ) 20 MG tablet Take 1 tablet (20 mg total) by mouth at bedtime. 30 tablet 0   levalbuterol  (XOPENEX  HFA) 45 MCG/ACT inhaler Inhale 2 puffs into the lungs every 6 (six) hours as needed for wheezing or shortness of breath. 1 each 5   fluticasone -salmeterol (WIXELA INHUB ) 250-50 MCG/ACT AEPB Inhale 1 puff into the lungs in the morning and at bedtime. 3 each 3   No facility-administered medications prior to visit.   Review of Systems  Constitutional:  Negative for chills, fever, malaise/fatigue and weight loss.  HENT:  Negative for congestion, sinus pain and sore throat.   Eyes: Negative.   Respiratory:  Negative for cough, hemoptysis, sputum production, shortness of breath and wheezing.   Cardiovascular:  Negative for chest  pain, palpitations, orthopnea, claudication and leg swelling.  Gastrointestinal:  Negative for abdominal pain, heartburn, nausea and vomiting.  Genitourinary: Negative.   Musculoskeletal:  Negative for joint pain and myalgias.  Skin:  Negative for rash.  Neurological:  Negative for weakness.  Endo/Heme/Allergies: Negative.   Psychiatric/Behavioral: Negative.     Objective:   Vitals:   06/14/23 1254  BP: 128/80  Pulse: 83  SpO2: 100%  Weight: 245 lb (111.1 kg)  Height: 5' 7.5" (1.715 m)    Physical Exam Constitutional:      General: He is not in acute distress. HENT:     Head: Normocephalic and atraumatic.  Eyes:     Conjunctiva/sclera: Conjunctivae normal.  Cardiovascular:     Rate and Rhythm: Normal rate and regular rhythm.     Pulses: Normal pulses.     Heart sounds: Normal heart sounds. No murmur heard. Pulmonary:     Breath sounds: No wheezing, rhonchi or rales.  Musculoskeletal:     Right lower leg: No edema.     Left lower leg: No edema.  Lymphadenopathy:     Cervical: No cervical adenopathy.  Skin:    General: Skin is warm and dry.  Neurological:     General: No focal deficit present.     Mental Status: He is alert.     CBC    Component Value Date/Time   WBC 10.9 (H) 08/03/2022 1451   RBC 5.78 08/03/2022 1451   HGB 16.5 08/03/2022 1451   HCT 50.1 08/03/2022 1451   PLT 320.0 08/03/2022 1451   MCV 86.7 08/03/2022 1451   MCH 28.6 11/13/2015 2358   MCHC 32.9 08/03/2022 1451   RDW 13.7 08/03/2022 1451   LYMPHSABS 2.8 08/03/2022 1451   MONOABS 0.7 08/03/2022 1451   EOSABS 1.4 (H) 08/03/2022 1451   BASOSABS 0.1 08/03/2022 1451      Latest Ref Rng & Units 11/25/2015   12:10 PM 11/13/2015   11:58 PM  BMP  Glucose 65 - 99 mg/dL 130  865   BUN 6 - 20 mg/dL 13  14   Creatinine 7.84 - 1.24 mg/dL 6.96  2.95   Sodium 284 - 145 mmol/L 142  135   Potassium 3.5 - 5.1 mmol/L 3.6  3.3   Chloride 101 - 111 mmol/L 104  106   CO2 22 - 32 mmol/L  21   Calcium  8.9 - 10.3 mg/dL  9.2    Chest imaging: CT Chest 08/09/22 Mediastinum/Nodes: No thoracic adenopathy. Unremarkable esophagus and trachea.   Lungs/Pleura: No focal consolidation, pleural effusion, or pneumothorax. No pulmonary nodule to correlate with opacity seen on radiograph 07/06/2022 which may have been projectional artifact from a nipple  shadow.  PFT:    Latest Ref Rng & Units 06/14/2023   11:08 AM  PFT Results  FVC-Pre L 4.11  P  FVC-Predicted Pre % 89  P  FVC-Post L 3.94  P  FVC-Predicted Post % 85  P  Pre FEV1/FVC % % 75  P  Post FEV1/FCV % % 58  P  FEV1-Pre L 3.08  P  FEV1-Predicted Pre % 86  P  FEV1-Post L 2.29  P  DLCO uncorrected ml/min/mmHg 28.85  P  DLCO [facility]% % 107  P  DLVA Predicted % 118  P  TLC L 6.12  P  TLC % Predicted % 94  P  RV % Predicted % 108  P    P Preliminary result    Labs:  Path:  Echo:  Heart Catheterization:     Assessment & Plan:   Mild intermittent asthma without complication - Plan: fluticasone -salmeterol (WIXELA INHUB ) 100-50 MCG/ACT AEPB, DISCONTINUED: fluticasone -salmeterol (WIXELA INHUB ) 100-50 MCG/ACT AEPB  Snoring - Plan: Home sleep test  Class 2 severe obesity due to excess calories with serious comorbidity and body mass index (BMI) of 37.0 to 37.9 in adult [facility]) - Plan: Home sleep test  Discussion: Dewan Emond is a 53 year old male, never smoker with GERD and hiatal hernia who is returns to pulmonary clinic for asthma.  Mild Intermittent Asthma - symptoms have been well controlled, PFTs within normal limits today. - step down inhaler therapy to Wixella 100-50mcg 1 puff twice daily from 250-50 - will add montelukast 10mg  daily if symptoms return  Follow up in 6 months  Duaine German, MD [facility] Office: [phone]   Current Outpatient Medications:    calcium carbonate (TUMS - DOSED IN MG ELEMENTAL CALCIUM) 500 MG chewable tablet, Chew 1 tablet by mouth as needed for indigestion  or heartburn., Disp: , Rfl:    famotidine  (PEPCID ) 20 MG tablet, Take 1 tablet (20 mg total) by mouth at bedtime., Disp: 30 tablet, Rfl: 0   levalbuterol  (XOPENEX  HFA) 45 MCG/ACT inhaler, Inhale 2 puffs into the lungs every 6 (six) hours as needed for wheezing or shortness of breath., Disp: 1 each, Rfl: 5   fluticasone -salmeterol (WIXELA INHUB ) 100-50 MCG/ACT AEPB, Inhale 1 puff into the lungs 2 (two) times daily., Disp: 60 each, Rfl: 11

## 2023-06-14 NOTE — Progress Notes (Signed)
 Full pft performed today.

## 2023-06-14 NOTE — Patient Instructions (Addendum)
 We will reduce your wixella dose to 100-50mcg 1 puff twice daily - rinse mouth out after each use  Continue albuterol inhaler as needed  Your breathing tests are within normal limits today  Follow up in 6 months, call sooner if needed
